# Patient Record
Sex: Female | Born: 1960
Health system: Southern US, Community
[De-identification: ages and names within clinical notes are randomized; demographics above are authoritative.]

## PROBLEM LIST (undated history)

## (undated) DIAGNOSIS — Z8509 Personal history of malignant neoplasm of other digestive organs: Secondary | ICD-10-CM

## (undated) DIAGNOSIS — R011 Cardiac murmur, unspecified: Secondary | ICD-10-CM

## (undated) DIAGNOSIS — C801 Malignant (primary) neoplasm, unspecified: Secondary | ICD-10-CM

## (undated) DIAGNOSIS — Z8349 Family history of other endocrine, nutritional and metabolic diseases: Secondary | ICD-10-CM

## (undated) DIAGNOSIS — IMO0002 Reserved for concepts with insufficient information to code with codable children: Secondary | ICD-10-CM

## (undated) DIAGNOSIS — E785 Hyperlipidemia, unspecified: Secondary | ICD-10-CM

## (undated) DIAGNOSIS — R87619 Unspecified abnormal cytological findings in specimens from cervix uteri: Secondary | ICD-10-CM

## (undated) DIAGNOSIS — E039 Hypothyroidism, unspecified: Secondary | ICD-10-CM

## (undated) DIAGNOSIS — E079 Disorder of thyroid, unspecified: Secondary | ICD-10-CM

## (undated) DIAGNOSIS — M810 Age-related osteoporosis without current pathological fracture: Secondary | ICD-10-CM

## (undated) HISTORY — DX: Disorder of thyroid, unspecified: E07.9

## (undated) HISTORY — DX: Cardiac murmur, unspecified: R01.1

## (undated) HISTORY — DX: Reserved for concepts with insufficient information to code with codable children: IMO0002

## (undated) HISTORY — DX: Family history of other endocrine, nutritional and metabolic diseases: Z83.49

## (undated) HISTORY — DX: Malignant (primary) neoplasm, unspecified: C80.1

## (undated) HISTORY — PX: COLONOSCOPY: SHX174

## (undated) HISTORY — DX: Unspecified abnormal cytological findings in specimens from cervix uteri: R87.619

## (undated) HISTORY — DX: Age-related osteoporosis without current pathological fracture: M81.0

## (undated) HISTORY — DX: Hyperlipidemia, unspecified: E78.5

---

## 2001-05-25 HISTORY — PX: CERVICAL BIOPSY  W/ LOOP ELECTRODE EXCISION: SUR135

## 2002-05-31 ENCOUNTER — Other Ambulatory Visit: Admission: RE | Admit: 2002-05-31 | Discharge: 2002-05-31 | Payer: Self-pay | Admitting: *Deleted

## 2002-12-04 ENCOUNTER — Other Ambulatory Visit: Admission: RE | Admit: 2002-12-04 | Discharge: 2002-12-04 | Payer: Self-pay | Admitting: Obstetrics and Gynecology

## 2003-06-08 ENCOUNTER — Other Ambulatory Visit: Admission: RE | Admit: 2003-06-08 | Discharge: 2003-06-08 | Payer: Self-pay | Admitting: Obstetrics and Gynecology

## 2003-12-12 ENCOUNTER — Other Ambulatory Visit: Admission: RE | Admit: 2003-12-12 | Discharge: 2003-12-12 | Payer: Self-pay | Admitting: Obstetrics and Gynecology

## 2004-07-07 ENCOUNTER — Other Ambulatory Visit: Admission: RE | Admit: 2004-07-07 | Discharge: 2004-07-07 | Payer: Self-pay | Admitting: Obstetrics and Gynecology

## 2004-07-08 ENCOUNTER — Other Ambulatory Visit: Admission: RE | Admit: 2004-07-08 | Discharge: 2004-07-08 | Payer: Self-pay | Admitting: Obstetrics and Gynecology

## 2005-02-02 ENCOUNTER — Other Ambulatory Visit: Admission: RE | Admit: 2005-02-02 | Discharge: 2005-02-02 | Payer: Self-pay | Admitting: Obstetrics and Gynecology

## 2005-02-03 ENCOUNTER — Other Ambulatory Visit: Admission: RE | Admit: 2005-02-03 | Discharge: 2005-02-03 | Payer: Self-pay | Admitting: Obstetrics and Gynecology

## 2005-05-01 ENCOUNTER — Other Ambulatory Visit: Admission: RE | Admit: 2005-05-01 | Discharge: 2005-05-01 | Payer: Self-pay | Admitting: Obstetrics and Gynecology

## 2005-05-04 ENCOUNTER — Other Ambulatory Visit: Admission: RE | Admit: 2005-05-04 | Discharge: 2005-05-04 | Payer: Self-pay | Admitting: Obstetrics and Gynecology

## 2008-09-19 ENCOUNTER — Ambulatory Visit: Payer: Self-pay | Admitting: Sports Medicine

## 2008-09-19 DIAGNOSIS — M21619 Bunion of unspecified foot: Secondary | ICD-10-CM

## 2008-09-19 DIAGNOSIS — M7750 Other enthesopathy of unspecified foot: Secondary | ICD-10-CM

## 2008-09-19 DIAGNOSIS — M21611 Bunion of right foot: Secondary | ICD-10-CM | POA: Insufficient documentation

## 2008-09-19 DIAGNOSIS — M7741 Metatarsalgia, right foot: Secondary | ICD-10-CM | POA: Insufficient documentation

## 2008-09-19 DIAGNOSIS — M79609 Pain in unspecified limb: Secondary | ICD-10-CM | POA: Insufficient documentation

## 2008-11-12 ENCOUNTER — Ambulatory Visit: Payer: Self-pay | Admitting: Sports Medicine

## 2010-07-15 ENCOUNTER — Ambulatory Visit (HOSPITAL_COMMUNITY): Admission: RE | Admit: 2010-07-15 | Payer: Self-pay | Source: Ambulatory Visit | Admitting: Obstetrics and Gynecology

## 2010-07-24 HISTORY — PX: OTHER SURGICAL HISTORY: SHX169

## 2010-08-05 ENCOUNTER — Ambulatory Visit (HOSPITAL_COMMUNITY)
Admission: RE | Admit: 2010-08-05 | Discharge: 2010-08-05 | Disposition: A | Discharge status: Home / Self Care | Payer: Federal, State, Local not specified - PPO | Source: Ambulatory Visit | Attending: Obstetrics and Gynecology | Admitting: Obstetrics and Gynecology

## 2010-08-05 ENCOUNTER — Other Ambulatory Visit: Payer: Self-pay | Admitting: Obstetrics and Gynecology

## 2010-08-05 DIAGNOSIS — D069 Carcinoma in situ of cervix, unspecified: Secondary | ICD-10-CM | POA: Insufficient documentation

## 2010-08-05 LAB — CBC
HCT: 40.1 % (ref 36.0–46.0)
Hemoglobin: 13.1 g/dL (ref 12.0–15.0)
MCH: 31 pg (ref 26.0–34.0)
MCV: 94.8 fL (ref 78.0–100.0)
RBC: 4.23 MIL/uL (ref 3.87–5.11)

## 2010-08-05 LAB — URINALYSIS, ROUTINE W REFLEX MICROSCOPIC
Glucose, UA: NEGATIVE mg/dL
Hgb urine dipstick: NEGATIVE
Ketones, ur: NEGATIVE mg/dL
pH: 7.5 (ref 5.0–8.0)

## 2010-09-10 NOTE — Op Note (Signed)
Carrie Allison, Carrie Allison                ACCOUNT NO.:  [id number]  MEDICAL RECORD NO.:  [id number]           PATIENT TYPE:  O  LOCATION:  [facility]                          [facility]  PHYSICIAN:  Randye Lobo, M.D.   DATE OF BIRTH:  1961-01-25  DATE OF PROCEDURE:  08/05/2010 DATE OF DISCHARGE:                              OPERATIVE REPORT   PREOPERATIVE DIAGNOSES: 1. Discrepancy between Pap smear and colposcopic biopsies. 2. Unsatisfactory colposcopy. 3. History of positive high-risk human papillomavirus and prior     cervical intraepithelial neoplasia III.  POSTOPERATIVE DIAGNOSES: 1. Discrepancy between Pap and colposcopic biopsies. 2. Unsatisfactory colposcopy. 3. History of positive high-risk human papillomavirus and cervical     intraepithelial neoplasia III. 4. Cervical stenosis.  PROCEDURES:  Cold knife conization of the cervix, fractional dilation and curettage.  SURGEON:  Randye Lobo, MD  ASSISTANT:  Gretchen Short, [facility] ANESTHESIA:  General endotracheal.  IV FLUIDS:  1500 mL Ringer's lactate.  ESTIMATED BLOOD LOSS:  Minimal.  URINE OUTPUT:  25 mL by I and O catheterization.  COMPLICATIONS:  None.  INDICATIONS FOR PROCEDURE:  The patient is a 50 year old gravida 1, para 1 Caucasian female, status post LEEP procedure for CIN-III in 1999, who is noted to have discrepancy between her most recent Pap smear and colposcopic biopsies.  The patient has had normal Pap smears for several years.  She does have a history of positive high-risk HPV.  The patient's Pap smear on April 25, 2010, documented ASCUS, cannot rule out high-grade squamous intraepithelial lesion.  Colposcopy performed May 29, 2010, was unsatisfactory.  The endocervical curettage and exocervical biopsies documented mildly inflamed transitional zone epithelium and squamous mucosa respectively.  A plan is now made to proceed with a cold knife conization of the cervix along with a fractional D and C  after risks, benefits, and alternatives are reviewed.  FINDINGS:  Exam under anesthesia revealed a stenotic cervix.  The vaginal mucosa showed signs of atrophy and petechiae just from the vaginal sterilizing prep.  There were no gross lesions of the cervix appreciated.  The cervix appeared to be somewhat flush with the posterior vaginal mucosa.  Palpable exam of the cervix, there appeared to be much less cervix present anteriorly than posteriorly.  With Lugol's application to the cervix, there was very sporadic uptake of the Lugol's in patchy regions on both the cervix and the vaginal mucosa at the apex.  SPECIMENS:  The conization of the cervix was sent to pathology, marked at the 12 o'clock position with a silk suture.  The endocervical and endometrial curettings were sent also as separate specimens.  PROCEDURE:  The patient was reidentified in the preoperative hold area. She received cefotetan 1 g IV.  The patient was escorted to the operating room, where general endotracheal anesthesia was induced.  She was placed in the dorsal lithotomy position.  The vagina and perineum were sterilely prepped and the bladder was catheterized of urine.  She was sterilely draped.  An examination under anesthesia was performed.  There were no palpable abnormalities of the cervix.  The speculum was then placed in  the vagina.  Small dilators were used to identigy the cervical os. Lugol was used to paint the cervix.  Figure-of-eight sutures of #1 chromic were then placed at the 3 and 9 o'clock position to ligate the descending branches of the uterine arteries bilaterally.  A separate simple suture of #1 chromic was placed at the 12 o'clock position on the cervix to act as a retractor.  A scalpel was then used to sharply excise the cone specimen.  A scalpel and an angled handle was then used to excise a cone shaped wedge of the cervix.  The specimen was marked at the 12 o'clock position with  silk suture.  The endocervix was further dilated.  An endocervical curettage was performed with the working curette.  The uterus appeared to be very short in depth measuring approximately 6 cm.  The endometrium was similarly curetted with a working curette.  The specimens were all sent to pathology separately.  Monopolar cautery and a ball was then used to coagulate the perimeter of the base of the conization and this provided excellent hemostasis. Monsel's was then applied across the surgical field.  Hemostasis was good at this time and the procedure was, therefore, concluded.  The patient was taken out of the dorsal lithotomy position.  She was awakened and extubated and escorted to the recovery room in stable condition.  There were no complications to the procedure.  All needle, instrument, and sponge counts were correct.     Randye Lobo, M.D.     BES/MEDQ  D:  08/05/2010  T:  08/06/2010  Job:  [id number]  Electronically Signed by Conley Simmonds M.D. on 09/10/2010 12:12:44 PM

## 2010-11-20 ENCOUNTER — Other Ambulatory Visit: Payer: Self-pay | Admitting: Obstetrics and Gynecology

## 2011-02-19 ENCOUNTER — Other Ambulatory Visit: Payer: Self-pay | Admitting: Obstetrics and Gynecology

## 2011-05-07 ENCOUNTER — Other Ambulatory Visit: Payer: Self-pay | Admitting: Obstetrics and Gynecology

## 2011-05-07 ENCOUNTER — Other Ambulatory Visit (HOSPITAL_COMMUNITY): Payer: Self-pay | Admitting: Obstetrics and Gynecology

## 2011-05-07 DIAGNOSIS — Z780 Asymptomatic menopausal state: Secondary | ICD-10-CM

## 2011-05-07 DIAGNOSIS — N950 Postmenopausal bleeding: Secondary | ICD-10-CM

## 2011-05-13 ENCOUNTER — Ambulatory Visit (HOSPITAL_COMMUNITY)
Admission: RE | Admit: 2011-05-13 | Discharge: 2011-05-13 | Disposition: A | Discharge status: Home / Self Care | Payer: Federal, State, Local not specified - PPO | Source: Ambulatory Visit | Attending: Obstetrics and Gynecology | Admitting: Obstetrics and Gynecology

## 2011-05-13 DIAGNOSIS — Z780 Asymptomatic menopausal state: Secondary | ICD-10-CM

## 2011-05-13 DIAGNOSIS — Z13820 Encounter for screening for osteoporosis: Secondary | ICD-10-CM | POA: Insufficient documentation

## 2011-05-13 DIAGNOSIS — N950 Postmenopausal bleeding: Secondary | ICD-10-CM

## 2011-10-29 ENCOUNTER — Encounter: Payer: Self-pay | Admitting: Internal Medicine

## 2011-10-29 ENCOUNTER — Ambulatory Visit (INDEPENDENT_AMBULATORY_CARE_PROVIDER_SITE_OTHER): Payer: Federal, State, Local not specified - PPO | Admitting: Internal Medicine

## 2011-10-29 VITALS — BP 126/78 | HR 78 | Temp 97.7°F | Resp 16 | Ht 66.0 in | Wt 124.0 lb

## 2011-10-29 DIAGNOSIS — Z8669 Personal history of other diseases of the nervous system and sense organs: Secondary | ICD-10-CM

## 2011-10-29 DIAGNOSIS — Z780 Asymptomatic menopausal state: Secondary | ICD-10-CM

## 2011-10-29 DIAGNOSIS — R87612 Low grade squamous intraepithelial lesion on cytologic smear of cervix (LGSIL): Secondary | ICD-10-CM

## 2011-10-29 DIAGNOSIS — Z87898 Personal history of other specified conditions: Secondary | ICD-10-CM

## 2011-10-29 DIAGNOSIS — IMO0002 Reserved for concepts with insufficient information to code with codable children: Secondary | ICD-10-CM | POA: Insufficient documentation

## 2011-10-29 NOTE — Progress Notes (Signed)
  Subjective:    Patient ID: Carrie Allison, female    DOB: 06/15/1960, 51 y.o.   MRN: [id number]  HPI New pt here for first visit  Former care Dr. Edward Allison.  She also has an appt with Dr. Clelia Allison of [facility] for primary care but has not seen his as yet. PMH of abnormal pap with cryosurgery done 07/2010.  Her last pap was in December to her memory,   She reports that she is having trouble with night flushes Wakes her up 1-2 times per night. No flushing during the day .   No vaginal dryness.  She has been on topical Testim gel  For low libido but she states she is not sure it has made a differerence.    No personal or FHof DVT, PE, breast cancer, no MI, or CVA.  MGM has some type of GYN cancer pt thinks it might have been uterine.    No Known Allergies Past Medical History  Diagnosis Date  . Abnormal Pap smear     H/O  . Sleep apnea    Past Surgical History  Procedure Date  . Cervical biopsy  w/ loop electrode excision 2003  . Cold knife conization 3/12   History   Social History  . Marital Status: Married    Spouse Name: N/A    Number of Children: N/A  . Years of Education: N/A   Occupational History  . Not on file.   Social History Main Topics  . Smoking status: Never Smoker   . Smokeless tobacco: Never Used  . Alcohol Use: No  . Drug Use: No  . Sexually Active: Yes -- Female partner(s)   Other Topics Concern  . Not on file   Social History Narrative  . No narrative on file   Family History  Problem Relation Age of Onset  . Cancer Father     lung  . Hypertension Mother   . Cancer Maternal Grandmother     breast  . Cancer Father     melanoma   Patient Active Problem List  Diagnoses  . METATARSALGIA  . BUNION, RIGHT FOOT  . FOOT PAIN, BILATERAL   Current Outpatient Prescriptions on File Prior to Visit  Medication Sig Dispense Refill  . TESTIM 50 MG/5GM GEL            Review of Systems See HPI    Objective:   Physical Exam  Physical Exam  Nursing  note and vitals reviewed.  Constitutional: She is oriented to person, place, and time. She appears well-developed and well-nourished.  HENT:  Head: Normocephalic and atraumatic.  Cardiovascular: Normal rate and regular rhythm. Exam reveals no gallop and no friction rub.  No murmur heard.  Pulmonary/Chest: Breath sounds normal. She has no wheezes. She has no rales.  Neurological: She is alert and oriented to person, place, and time.  Skin: Skin is warm and dry.  Psychiatric: She has a normal mood and affect. Her behavior is normal.  Ext no edema      Assessment & Plan:  1)  Abnormal pap  Last pap 04/2011 showed LGSIL  She is S/P cryosurgery and will refer to GYN for further evaluation.  She report she has a long history of abnormal paps  Menopause  Given risk/benefit educational sheet.  She states she would like to try OTC Estroven at first.    History of sleep apnea

## 2011-10-29 NOTE — Patient Instructions (Signed)
Will refer to GYN  Dr. Eustaquio Boyden

## 2011-12-23 ENCOUNTER — Other Ambulatory Visit: Payer: Self-pay | Admitting: Obstetrics and Gynecology

## 2012-01-29 ENCOUNTER — Encounter: Payer: Self-pay | Admitting: *Deleted

## 2012-02-17 ENCOUNTER — Encounter: Payer: Federal, State, Local not specified - PPO | Admitting: Internal Medicine

## 2012-03-03 ENCOUNTER — Encounter: Payer: Self-pay | Admitting: *Deleted

## 2012-03-11 ENCOUNTER — Encounter: Payer: Self-pay | Admitting: *Deleted

## 2012-05-04 ENCOUNTER — Ambulatory Visit (INDEPENDENT_AMBULATORY_CARE_PROVIDER_SITE_OTHER): Payer: Federal, State, Local not specified - PPO | Admitting: Sports Medicine

## 2012-05-04 VITALS — BP 110/68 | Ht 67.0 in | Wt 123.0 lb

## 2012-05-04 DIAGNOSIS — M238X9 Other internal derangements of unspecified knee: Secondary | ICD-10-CM

## 2012-05-04 DIAGNOSIS — M25361 Other instability, right knee: Secondary | ICD-10-CM

## 2012-05-04 NOTE — Patient Instructions (Signed)
It was good to see you today.  Please wear the compression sleeve with exercise or when you will be taking a lot of steps.  Do your 3 exercises daily, 15 times each.  We will see you back in 6 weeks, or sooner if needed.  Take care!

## 2012-05-04 NOTE — Assessment & Plan Note (Addendum)
Physical exam consistent with medial patellar instability. Given small body helix patellar strap to help stabilize the patella. Although this is not causing any pain now, it may eventually lead to arthritis. For now, will work on strengthening hip muscles. in particular abductors, with daily exercises (lateral leg raises, standing hip rotations and straight leg raise with foot rotated medially). She should decrease the amount of stepping exercises she does such as her step aerobics class, or if she does the class she should use a small step. She will return in 6 weeks for re-evaluation.

## 2012-05-04 NOTE — Progress Notes (Signed)
  Subjective:    Patient ID: Carrie Allison, female    DOB: December 08, 1960, 51 y.o.   MRN: [id number]  HPI  Pt is a 51 yo F new patient with right knee popping/grinding. She denies any pain in the knee with any movement. She states she has noticed some slight popping in her knee for a while but it has been getting worse in the last 2 months. She states it does not interfere with her daily life. She is unable to identify exactly where she feels the pop. It is most noticeable with climbing stairs. She has no noises when stepping down, but almost always consistently with stepping up. She does work out on a regular basis with [employer], weight lifting and step class- none of which cause her pain to her knee. She denies any injury or past trauma to the knee. She has not tried anything for the popping. Only other pain is mild tennis elbow, but no known osteoarthritis.  Review of Systems Negative except as mentioned in HPI above    Objective:   Physical Exam  Gen: awake, alert, NAD. Healthy appearing female Extremities: Warm and neurovascularly intact. Leg length symmetric. No redness or swelling of knees. Full extension of knees. 5/5 strength.  Right knee: Extension 0-1500 degrees flexion. No crepitus appreciated. Full range of motion with no pain. No TTP of palpation of patella or patellar tendon. No TTP medial or lateral joint line.  Gait: Normal stride. Audible grinding of right knee with step up on platform, without significant pain. With palpation, it feels like site of grinding is medial side of sulcus underneath medial patellar border..     Assessment & Plan:

## 2012-06-15 ENCOUNTER — Ambulatory Visit: Payer: Federal, State, Local not specified - PPO | Admitting: Sports Medicine

## 2012-07-19 ENCOUNTER — Other Ambulatory Visit: Payer: Self-pay | Admitting: Obstetrics and Gynecology

## 2013-05-01 ENCOUNTER — Other Ambulatory Visit (HOSPITAL_COMMUNITY): Payer: Self-pay | Admitting: Obstetrics and Gynecology

## 2013-08-01 ENCOUNTER — Encounter: Payer: Self-pay | Admitting: Internal Medicine

## 2013-08-15 ENCOUNTER — Other Ambulatory Visit: Payer: Self-pay | Admitting: Obstetrics and Gynecology

## 2013-09-19 ENCOUNTER — Ambulatory Visit (AMBULATORY_SURGERY_CENTER): Payer: Self-pay | Admitting: *Deleted

## 2013-09-19 VITALS — Ht 66.0 in | Wt 122.0 lb

## 2013-09-19 DIAGNOSIS — Z1211 Encounter for screening for malignant neoplasm of colon: Secondary | ICD-10-CM

## 2013-09-19 MED ORDER — SOD PICOSULFATE-MAG OX-CIT ACD 10-3.5-12 MG-GM-GM PO PACK: PACK | ORAL | Status: DC

## 2013-09-19 NOTE — Progress Notes (Signed)
No egg or soy allergy  No home oxygen use or problems with anesthesia  No medications for weight loss taken   

## 2013-09-25 ENCOUNTER — Encounter: Payer: Self-pay | Admitting: Internal Medicine

## 2013-10-03 ENCOUNTER — Encounter: Payer: Federal, State, Local not specified - PPO | Admitting: Internal Medicine

## 2013-10-05 ENCOUNTER — Encounter: Payer: Self-pay | Admitting: Internal Medicine

## 2013-10-05 ENCOUNTER — Ambulatory Visit (AMBULATORY_SURGERY_CENTER): Payer: Federal, State, Local not specified - PPO | Admitting: Internal Medicine

## 2013-10-05 VITALS — BP 126/74 | HR 54 | Temp 97.4°F | Resp 20 | Ht 66.0 in | Wt 122.0 lb

## 2013-10-05 DIAGNOSIS — Z1211 Encounter for screening for malignant neoplasm of colon: Secondary | ICD-10-CM

## 2013-10-05 MED ORDER — SODIUM CHLORIDE 0.9 % IV SOLN: 500.0000 mL | INTRAVENOUS | Status: DC

## 2013-10-05 NOTE — Op Note (Signed)
[place]  [address] [place], [id number]   COLONOSCOPY PROCEDURE REPORT  PATIENT: Carrie Allison, Carrie Allison  MR#: [id number] BIRTHDATE: [birth year] , 89  yrs. old GENDER: Female ENDOSCOPIST: Gatha Mayer, MD, [facility] REFERRED BY:W.  Lutricia Feil, M.D. PROCEDURE DATE:  10/05/2013 PROCEDURE:   Colonoscopy, screening First Screening Colonoscopy - Avg.  risk and is 50 yrs.  old or older Yes.  Prior Negative Screening - Now for repeat screening. N/A  History of Adenoma - Now for follow-up colonoscopy & has been > or = to 3 yrs.  N/A  Polyps Removed Today? No.  Recommend repeat exam, <10 yrs? No. ASA CLASS:   Class II INDICATIONS:average risk screening and first colonoscopy. MEDICATIONS: propofol (Diprivan) 200mg  IV, MAC sedation, administered by CRNA, and These medications were titrated to patient response per physician's verbal order  DESCRIPTION OF PROCEDURE:   After the risks benefits and alternatives of the procedure were thoroughly explained, informed consent was obtained.  A digital rectal exam revealed no abnormalities of the rectum.   The LB [id number]  endoscope was introduced through the anus and advanced to the cecum, which was identified by both the appendix and ileocecal valve. No adverse events experienced.   The quality of the prep was Prepopik adequate The instrument was then slowly withdrawn as the colon was fully examined.      COLON FINDINGS: A normal appearing cecum, ileocecal valve, and appendiceal orifice were identified.  The ascending, hepatic flexure, transverse, splenic flexure, descending, sigmoid colon and rectum appeared unremarkable.  No polyps or cancers were seen.   A right colon retroflexion was performed.  Retroflexed views revealed no abnormalities. The time to cecum=2 minutes 33 seconds. Withdrawal time=14 minutes 10 seconds.  The scope was withdrawn and the procedure completed. COMPLICATIONS: There were no  complications.  ENDOSCOPIC IMPRESSION: Normal colonoscopy  RECOMMENDATIONS: Repeat colonoscopy 10 years (2025)   eSigned:  Gatha Mayer, MD, [facility] 10/05/2013 9:36 AM   cc: Janalyn Rouse, MD and The Patient

## 2013-10-05 NOTE — Progress Notes (Signed)
A/ox3 pleased with MAC, report to Celia RN 

## 2013-10-05 NOTE — Patient Instructions (Addendum)
The colonoscopy was normal.  Next routine colonoscopy in 10 years - 2025  I appreciate the opportunity to care for you. Gatha Mayer, MD, [facility]   Discharge instructions given with verbal understanding. Normal exam. Resume previous medications. YOU HAD AN ENDOSCOPIC PROCEDURE TODAY AT [facility]: Refer to the procedure report that was given to you for any specific questions about what was found during the examination.  If the procedure report does not answer your questions, please call your gastroenterologist to clarify.  If you requested that your care partner not be given the details of your procedure findings, then the procedure report has been included in a sealed envelope for you to review at your convenience later.  YOU SHOULD EXPECT: Some feelings of bloating in the abdomen. Passage of more gas than usual.  Walking can help get rid of the air that was put into your GI tract during the procedure and reduce the bloating. If you had a lower endoscopy (such as a colonoscopy or flexible sigmoidoscopy) you may notice spotting of blood in your stool or on the toilet paper. If you underwent a bowel prep for your procedure, then you may not have a normal bowel movement for a few days.  DIET: Your first meal following the procedure should be a light meal and then it is ok to progress to your normal diet.  A half-sandwich or bowl of soup is an example of a good first meal.  Heavy or fried foods are harder to digest and may make you feel nauseous or bloated.  Likewise meals heavy in dairy and vegetables can cause extra gas to form and this can also increase the bloating.  Drink plenty of fluids but you should avoid alcoholic beverages for 24 hours.  ACTIVITY: Your care partner should take you home directly after the procedure.  You should plan to take it easy, moving slowly for the rest of the day.  You can resume normal activity the day after the procedure however you should NOT DRIVE  or use heavy machinery for 24 hours (because of the sedation medicines used during the test).    SYMPTOMS TO REPORT IMMEDIATELY: A gastroenterologist can be reached at any hour.  During normal business hours, 8:30 AM to 5:00 PM Monday through Friday, call [phone].  After hours and on weekends, please call the GI answering service at [phone] who will take a message and have the physician on call contact you.   Following lower endoscopy (colonoscopy or flexible sigmoidoscopy):  Excessive amounts of blood in the stool  Significant tenderness or worsening of abdominal pains  Swelling of the abdomen that is new, acute  Fever of 100F or higher  FOLLOW UP: If any biopsies were taken you will be contacted by phone or by letter within the next 1-3 weeks.  Call your gastroenterologist if you have not heard about the biopsies in 3 weeks.  Our staff will call the home number listed on your records the next business day following your procedure to check on you and address any questions or concerns that you may have at that time regarding the information given to you following your procedure. This is a courtesy call and so if there is no answer at the home number and we have not heard from you through the emergency physician on call, we will assume that you have returned to your regular daily activities without incident.  SIGNATURES/CONFIDENTIALITY: You and/or your care partner have signed paperwork  which will be entered into your electronic medical record.  These signatures attest to the fact that that the information above on your After Visit Summary has been reviewed and is understood.  Full responsibility of the confidentiality of this discharge information lies with you and/or your care-partner.

## 2013-10-06 ENCOUNTER — Telehealth: Payer: Self-pay | Admitting: *Deleted

## 2013-10-06 NOTE — Telephone Encounter (Signed)
  Follow up Call-  Call back number 10/05/2013  Post procedure Call Back phone  # [phone]  Permission to leave phone message Yes     Patient questions:  Do you have a fever, pain , or abdominal swelling? no Pain Score  0 *  Have you tolerated food without any problems? yes  Have you been able to return to your normal activities? yes  Do you have any questions about your discharge instructions: Diet   no Medications  no Follow up visit  no  Do you have questions or concerns about your Care? no  Actions: * If pain score is 4 or above: No action needed, pain <4.

## 2014-03-26 ENCOUNTER — Encounter: Payer: Self-pay | Admitting: Internal Medicine

## 2014-04-12 ENCOUNTER — Other Ambulatory Visit (HOSPITAL_COMMUNITY): Payer: Self-pay | Admitting: Internal Medicine

## 2014-04-12 DIAGNOSIS — M8580 Other specified disorders of bone density and structure, unspecified site: Secondary | ICD-10-CM

## 2014-05-01 ENCOUNTER — Ambulatory Visit (HOSPITAL_COMMUNITY)
Admission: RE | Admit: 2014-05-01 | Discharge: 2014-05-01 | Disposition: A | Discharge status: Home / Self Care | Payer: Federal, State, Local not specified - PPO | Source: Ambulatory Visit | Attending: Internal Medicine | Admitting: Internal Medicine

## 2014-05-01 DIAGNOSIS — M8580 Other specified disorders of bone density and structure, unspecified site: Secondary | ICD-10-CM | POA: Diagnosis not present

## 2014-05-01 DIAGNOSIS — Z13820 Encounter for screening for osteoporosis: Secondary | ICD-10-CM | POA: Insufficient documentation

## 2014-08-29 ENCOUNTER — Other Ambulatory Visit: Payer: Self-pay | Admitting: Obstetrics and Gynecology

## 2014-08-30 LAB — CYTOLOGY - PAP

## 2015-09-11 DIAGNOSIS — Z6820 Body mass index (BMI) 20.0-20.9, adult: Secondary | ICD-10-CM | POA: Diagnosis not present

## 2015-09-11 DIAGNOSIS — Z1231 Encounter for screening mammogram for malignant neoplasm of breast: Secondary | ICD-10-CM | POA: Diagnosis not present

## 2015-09-11 DIAGNOSIS — Z01419 Encounter for gynecological examination (general) (routine) without abnormal findings: Secondary | ICD-10-CM | POA: Diagnosis not present

## 2016-02-19 DIAGNOSIS — D1801 Hemangioma of skin and subcutaneous tissue: Secondary | ICD-10-CM | POA: Diagnosis not present

## 2016-02-19 DIAGNOSIS — D2272 Melanocytic nevi of left lower limb, including hip: Secondary | ICD-10-CM | POA: Diagnosis not present

## 2016-02-19 DIAGNOSIS — D225 Melanocytic nevi of trunk: Secondary | ICD-10-CM | POA: Diagnosis not present

## 2016-02-19 DIAGNOSIS — D2261 Melanocytic nevi of right upper limb, including shoulder: Secondary | ICD-10-CM | POA: Diagnosis not present

## 2016-02-20 DIAGNOSIS — E038 Other specified hypothyroidism: Secondary | ICD-10-CM | POA: Diagnosis not present

## 2016-02-20 DIAGNOSIS — Z0000 Encounter for general adult medical examination without abnormal findings: Secondary | ICD-10-CM | POA: Diagnosis not present

## 2016-02-20 DIAGNOSIS — E784 Other hyperlipidemia: Secondary | ICD-10-CM | POA: Diagnosis not present

## 2016-02-20 DIAGNOSIS — M859 Disorder of bone density and structure, unspecified: Secondary | ICD-10-CM | POA: Diagnosis not present

## 2016-03-05 DIAGNOSIS — Z0000 Encounter for general adult medical examination without abnormal findings: Secondary | ICD-10-CM | POA: Diagnosis not present

## 2016-03-05 DIAGNOSIS — M5382 Other specified dorsopathies, cervical region: Secondary | ICD-10-CM | POA: Diagnosis not present

## 2016-03-05 DIAGNOSIS — E784 Other hyperlipidemia: Secondary | ICD-10-CM | POA: Diagnosis not present

## 2016-03-05 DIAGNOSIS — Z23 Encounter for immunization: Secondary | ICD-10-CM | POA: Diagnosis not present

## 2016-03-05 DIAGNOSIS — M859 Disorder of bone density and structure, unspecified: Secondary | ICD-10-CM | POA: Diagnosis not present

## 2016-03-05 DIAGNOSIS — Z1389 Encounter for screening for other disorder: Secondary | ICD-10-CM | POA: Diagnosis not present

## 2016-03-05 DIAGNOSIS — E038 Other specified hypothyroidism: Secondary | ICD-10-CM | POA: Diagnosis not present

## 2016-03-06 DIAGNOSIS — Z1212 Encounter for screening for malignant neoplasm of rectum: Secondary | ICD-10-CM | POA: Diagnosis not present

## 2016-07-09 ENCOUNTER — Encounter: Payer: Self-pay | Admitting: Sports Medicine

## 2016-07-09 ENCOUNTER — Ambulatory Visit (INDEPENDENT_AMBULATORY_CARE_PROVIDER_SITE_OTHER): Payer: Federal, State, Local not specified - PPO | Admitting: Sports Medicine

## 2016-07-09 VITALS — BP 139/76 | Ht 66.0 in | Wt 125.0 lb

## 2016-07-09 DIAGNOSIS — M238X1 Other internal derangements of right knee: Secondary | ICD-10-CM | POA: Diagnosis not present

## 2016-07-09 DIAGNOSIS — M7711 Lateral epicondylitis, right elbow: Secondary | ICD-10-CM

## 2016-07-09 DIAGNOSIS — M2420 Disorder of ligament, unspecified site: Secondary | ICD-10-CM | POA: Diagnosis not present

## 2016-07-09 MED ORDER — NITROGLYCERIN 0.2 MG/HR TD PT24: MEDICATED_PATCH | TRANSDERMAL | 1 refills | Status: DC

## 2016-07-09 NOTE — Assessment & Plan Note (Signed)
Work on hip abduction weakness as she is getting PF impingement

## 2016-07-09 NOTE — Patient Instructions (Addendum)
1) hip exercises 2) elbow exercises 3) hamstring exercises 4) 1/4 patch of nitroglycerin daily  Nitroglycerin Protocol   Apply 1/4 nitroglycerin patch to affected area daily.  Change position of patch within the affected area every 24 hours.  You may experience a headache during the first 1-2 weeks of using the patch, these should subside.  If you experience headaches after beginning nitroglycerin patch treatment, you may take your preferred over the counter pain reliever.  Another side effect of the nitroglycerin patch is skin irritation or rash related to patch adhesive.  Please notify our office if you develop more severe headaches or rash, and stop the patch.  Tendon healing with nitroglycerin patch may require 12 to 24 weeks depending on the extent of injury.  Men should not use if taking Viagra, Cialis, or Levitra.   Do not use if you have migraines or rosacea.

## 2016-07-09 NOTE — Assessment & Plan Note (Signed)
Plan conservative care with NTG and HEP  If not better check [place] on RTC  6 wks

## 2016-07-09 NOTE — Progress Notes (Signed)
   Subjective:    Patient ID: Carrie Allison, female    DOB: 01-26-1961, 56 y.o.   MRN: [id number]  HPI  R elbow pain Pain started in November. Patient picked up a box that was too heavy and felt a pain in the right outside of her elbow. Pain is persistent and interferes with daily activities. She is "very" right-hand dominant, but has been using her left hand a lot more than normal because of the pain. Pain is worse when holding arm out to open door, trying to drink from a water bottle or glass. Occasional (rare) numbness/tingling on the outside of there arm and in her most lateral fingers. Pain is improved when taking advil, but returns when she stops the medication. No erythema or swelling noted. Full ROM to joint, but pain at lateral elbow in full extension and flexion. Overall pain is slightly better since November, but still very noticeable. No numbness or tingling of fingers.  R knee cracking/popping When patient moves leg to full extension, audible cracking heard. No pain associated with this. No swelling. No history of knee trauma. Patient does work-out classes every day, some of which include squats. History of water skiing accident about 6-7 years ago leading to a torn hamstring. No other known injuries to leg.   L neck cracking/popping When patient looks to the left, she hears popping of her left neck. Daughter noticed how loud it was around Christmas time. Again, no tenderness to area. Full ROM of neck. No numbness or tingling of arms. Doesn't bother her, only curious about why the noise is there.  Review of Systems No fevers, weakness, numbness, tinglnig.    Objective:   Physical Exam  Constitutional: She is oriented to person, place, and time. She appears well-developed and well-nourished. No distress.  CV: Intact distal pulses. Musculoskeletal:  R elbow: No erythema, swelling, or bruising to joint. Tenderness to palpation along lateral epicondyle. Full ROM. L elbow normal. R  knee: Crepitus noted on extension of knee. Palpable and audible. Full ROM. No erythema, bruising, or swelling noted. Patella normal. Strength 4/5 on hip abduction of right leg, compared to 5/5 on hip abduction of left leg. Gluteus medius 3/5 on hip abduction when isolated. Neck: Full ROM. No tenderness to palpation of neck. Sensation intact bilaterally of both upper arms. Strength 5/5 bilaterally in upper arms. Popping sound noted when turning to look to the left at about 90 degrees. Neurological: She is alert and oriented to person, place, and time. strength 5/5 in upper extremities. Strength 5/5 in lower extremities without isolating muscles.  Psychiatric: She has a normal mood and affect. Her behavior is normal.   No imaging done today.     Assessment & Plan:   1. Lateral epicondylitis of right elbow - 1/4 nitroglycerin patch daily - elbow ROM exercises daily - return to clinic in 6 weeks for follow-up  2. Crepitus of joint of right knee - hamstring and hip strengthening exercises daily - avoid full squat - return to clinic in 6 weeks for follow-up  3. Laxity, ligament - reassurance given regarding neck popping - avoid full turning of head to the left to limit popping events  Patient seen and discussed with Dr. Oneida Alar, sports medicine physician.  Freddrick March, MD [facility], PGY-3 07/09/2016  11:38 AM  I observed and examined the patient with the resident and agree with assessment and plan.  Note reviewed and modified by me. Stefanie Libel, MD

## 2016-08-13 ENCOUNTER — Ambulatory Visit: Payer: Federal, State, Local not specified - PPO | Admitting: Sports Medicine

## 2016-09-01 ENCOUNTER — Encounter: Payer: Self-pay | Admitting: Sports Medicine

## 2016-09-01 ENCOUNTER — Ambulatory Visit (INDEPENDENT_AMBULATORY_CARE_PROVIDER_SITE_OTHER): Payer: Federal, State, Local not specified - PPO | Admitting: Sports Medicine

## 2016-09-01 DIAGNOSIS — M7711 Lateral epicondylitis, right elbow: Secondary | ICD-10-CM

## 2016-09-01 DIAGNOSIS — M238X1 Other internal derangements of right knee: Secondary | ICD-10-CM

## 2016-09-01 NOTE — Progress Notes (Signed)
  Carrie Allison - 56 y.o. female MRN [id number]  Date of birth: May 16, 1961  SUBJECTIVE:  Including CC & ROS.   Carrie Allison is a 56 yo F that is following up for right elbow pain and right knee pain. She has been doing her home exercises for her lateral elbow pain they have been helping. She had an acute exacerbation of her elbow pain when she went on vacation. She was having a whole around luggage and this seemed to exacerbate her pain. Prior to that her pain was almost gone. She has cut down on the amount of ibuprofen that she has been taking. She is still using the nitroglycerin. She has not received any injection or physical therapy to her elbow.  She reports that she is having some popping sensation of her right knee. She was in a step aerobics class and had some pain after this. She has been doing her hamstring exercises and reports the pain is mild. She denies any locking or giving way.  ROS: No unexpected weight loss, fever, chills, swelling, instability, muscle pain, numbness/tingling, redness, otherwise see HPI    HISTORY: Past Medical, Surgical, Social, and Family History Reviewed & Updated per EMR.   Pertinent Historical Findings include: PMSHx -  Hypothyroidism  PSHx - no tobacco use     DATA REVIEWED: None  PHYSICAL EXAM:  VS: BP:128/65  HR:68bpm  TEMP: ( )  RESP:   HT:5\' 6"  (167.6 cm)   WT:125 lb (56.7 kg)  BMI:20.2 PHYSICAL EXAM: Gen: NAD, alert, cooperative with exam, well-appearing HEENT: clear conjunctiva, EOMI CV:  no edema, capillary refill brisk,  Resp: non-labored, normal speech Skin: no rashes, normal turgor  Neuro: no gross deficits.  Psych:  alert and oriented Right Elbow: Unremarkable to inspection. Range of motion full pronation, supination, flexion, extension. Strength is full to all of the above directions Stable to varus, valgus stress.  Normal wrist ROM  Some TTP of the lateral epicondyle Right Knee: Normal to inspection with no erythema or  effusion or obvious bony abnormalities. Palpation normal with no warmth, joint line tenderness, patellar tenderness, or condyle tenderness. ROM full in flexion and extension and lower leg rotation. Ligaments with solid consistent endpoints including  LCL, MCL. Non painful patellar compression. Patellar glide without crepitus. Patellar and quadriceps tendons unremarkable. Quadriceps strength is normal.  Some weakness with bilateral hip abduction but improved from previously Somewhat less she was performing 1 leg step downs on the right compared to the left Neurovascular intact    ASSESSMENT & PLAN:   Lateral epicondylitis of right elbow Had improvement with conservative therapy with recent acute exacerbation. - Continue home exercises - Continue nitroglycerin - Follow-up in 2 months  Crepitus of joint of right knee Has some weakness in her hips that is likely contributing to her popping and misalignment with activity. - Continue home exercise program already prescribed - Add 1 leg step downs and 1 leg mini squats with dumbbells - Follow-up in 2 months

## 2016-09-01 NOTE — Assessment & Plan Note (Signed)
Has some weakness in her hips that is likely contributing to her popping and misalignment with activity. - Continue home exercise program already prescribed - Add 1 leg step downs and 1 leg mini squats with dumbbells - Follow-up in 2 months

## 2016-09-01 NOTE — Assessment & Plan Note (Signed)
Had improvement with conservative therapy with recent acute exacerbation. - Continue home exercises - Continue nitroglycerin - Follow-up in 2 months

## 2016-09-14 DIAGNOSIS — Z01419 Encounter for gynecological examination (general) (routine) without abnormal findings: Secondary | ICD-10-CM | POA: Diagnosis not present

## 2016-09-14 DIAGNOSIS — Z1231 Encounter for screening mammogram for malignant neoplasm of breast: Secondary | ICD-10-CM | POA: Diagnosis not present

## 2016-09-14 DIAGNOSIS — Z6820 Body mass index (BMI) 20.0-20.9, adult: Secondary | ICD-10-CM | POA: Diagnosis not present

## 2016-10-29 ENCOUNTER — Ambulatory Visit: Payer: Federal, State, Local not specified - PPO | Admitting: Sports Medicine

## 2016-11-10 ENCOUNTER — Encounter: Payer: Self-pay | Admitting: Sports Medicine

## 2016-11-10 ENCOUNTER — Ambulatory Visit (INDEPENDENT_AMBULATORY_CARE_PROVIDER_SITE_OTHER): Payer: Federal, State, Local not specified - PPO | Admitting: Sports Medicine

## 2016-11-10 DIAGNOSIS — M25361 Other instability, right knee: Secondary | ICD-10-CM

## 2016-11-10 DIAGNOSIS — M7711 Lateral epicondylitis, right elbow: Secondary | ICD-10-CM

## 2016-11-10 NOTE — Progress Notes (Signed)
  BAMA HANSELMAN - 56 y.o. female MRN [id number]  Date of birth: 14-Jan-1961  SUBJECTIVE:  Including CC & ROS.   Ms. Dagley is a 58 showed female is following up for her right elbow pain in her right knee pain. She reports that her right elbow is almost percent. She has been beginning to do her regular workouts and yoga poses. She feels like her elbow is weaker than her left side. She is not taking any medications for the pain. Just continue to do home exercises that were provided.   Right knee is still popping from time to time. She did tear her hamstring about 5-6 years ago. She has been compliant with her home exercise program and feels stronger. She denies any pain but does have a popping sensation found to time. She does have some tenderness going down stairs. She is not using any compression eyes. She denies any swelling, locking, or giving way.  ROS: No unexpected weight loss, fever, chills, swelling, instability, muscle pain, numbness/tingling, redness, otherwise see HPI   HISTORY: Past Medical, Surgical, Social, and Family History Reviewed & Updated per EMR.   Pertinent Historical Findings include: PMHx: hypothyroidism Surgical:    cervical biopsy  Social:  No tobacco use   PHYSICAL EXAM:  VS: BP 116/78   Ht 5\' 6"  (1.676 m)   Wt 125 lb (56.7 kg)   BMI 20.18 kg/m  PHYSICAL EXAM: Gen: NAD, alert, cooperative with exam, well-appearing HEENT: clear conjunctiva, EOMI CV:  no edema, capillary refill brisk,  Resp: non-labored, normal speech Skin: no rashes, normal turgor  Neuro: no gross deficits.  Psych:  alert and oriented MSK:  Right Elbow: Unremarkable to inspection. Range of motion full pronation, supination, flexion, extension. Strength is full to all of the above directions Right Knee: Normal to inspection with no erythema or effusion or obvious bony abnormalities. Palpation normal with no warmth, joint line tenderness, patellar tenderness, or condyle tenderness. ROM full  in flexion and extension and lower leg rotation. Ligaments with solid consistent endpoints including LCL, MCL. Negative Mcmurray's Non painful patellar compression. Patellar glide without crepitus. Patellar and quadriceps tendons unremarkable. Hamstring and quadriceps strength is normal. Good strength with hamstring testing as well as hip abduction.  Left and right Feet:  Loss of the transverse arch bilaterally. Splaying between the second and third digit on the right. Some splaying between the second and third on the left as well. Some callus formation on the plantar forefoot. Has maintained her longitudinal arch. Neurovascularly intact.   ASSESSMENT & PLAN:   Patellar instability of right knee She has significant increase strength of her hip abduction. She does have some popping but no pain. - Advised to continue her home exercise program as 3 times a week. - She had follow-up as needed   Lateral epicondylitis of right elbow She seems to almost have complete resolution of her symptoms. She is starting to get back to regular exercise routine with no symptoms. She can follow-up with [place] as needed and encouraged to continue her home exercises

## 2016-11-10 NOTE — Assessment & Plan Note (Addendum)
She has significant increase strength of her hip abduction. She does have some popping but no pain. - Advised to continue her home exercise program as 3 times a week. - She had follow-up as needed

## 2016-11-10 NOTE — Assessment & Plan Note (Signed)
She seems to almost have complete resolution of her symptoms. She is starting to get back to regular exercise routine with no symptoms. She can follow-up with [place] as needed and encouraged to continue her home exercises

## 2016-11-11 NOTE — Patient Instructions (Signed)
Diet recommendations (anti-inflammatory diet) that will assist you with your joint issues:   Kale  arugula  Collards  Beets  Red spinach  Broccoli   Also, consider a minimum of 3-5 servings of fruit and 3-5 servings of vegetables daily.

## 2017-03-02 DIAGNOSIS — M859 Disorder of bone density and structure, unspecified: Secondary | ICD-10-CM | POA: Diagnosis not present

## 2017-03-02 DIAGNOSIS — D2272 Melanocytic nevi of left lower limb, including hip: Secondary | ICD-10-CM | POA: Diagnosis not present

## 2017-03-02 DIAGNOSIS — D2261 Melanocytic nevi of right upper limb, including shoulder: Secondary | ICD-10-CM | POA: Diagnosis not present

## 2017-03-02 DIAGNOSIS — E7849 Other hyperlipidemia: Secondary | ICD-10-CM | POA: Diagnosis not present

## 2017-03-02 DIAGNOSIS — D225 Melanocytic nevi of trunk: Secondary | ICD-10-CM | POA: Diagnosis not present

## 2017-03-02 DIAGNOSIS — R8290 Unspecified abnormal findings in urine: Secondary | ICD-10-CM | POA: Diagnosis not present

## 2017-03-02 DIAGNOSIS — D1801 Hemangioma of skin and subcutaneous tissue: Secondary | ICD-10-CM | POA: Diagnosis not present

## 2017-03-02 DIAGNOSIS — E038 Other specified hypothyroidism: Secondary | ICD-10-CM | POA: Diagnosis not present

## 2017-03-09 DIAGNOSIS — Z23 Encounter for immunization: Secondary | ICD-10-CM | POA: Diagnosis not present

## 2017-03-09 DIAGNOSIS — E7849 Other hyperlipidemia: Secondary | ICD-10-CM | POA: Diagnosis not present

## 2017-03-09 DIAGNOSIS — R102 Pelvic and perineal pain: Secondary | ICD-10-CM | POA: Diagnosis not present

## 2017-03-09 DIAGNOSIS — Z0000 Encounter for general adult medical examination without abnormal findings: Secondary | ICD-10-CM | POA: Diagnosis not present

## 2017-03-09 DIAGNOSIS — E038 Other specified hypothyroidism: Secondary | ICD-10-CM | POA: Diagnosis not present

## 2017-03-09 DIAGNOSIS — Z1389 Encounter for screening for other disorder: Secondary | ICD-10-CM | POA: Diagnosis not present

## 2017-03-09 DIAGNOSIS — M8580 Other specified disorders of bone density and structure, unspecified site: Secondary | ICD-10-CM | POA: Diagnosis not present

## 2017-03-11 ENCOUNTER — Other Ambulatory Visit: Payer: Self-pay | Admitting: Internal Medicine

## 2017-03-11 DIAGNOSIS — R102 Pelvic and perineal pain: Secondary | ICD-10-CM

## 2017-03-15 DIAGNOSIS — Z1212 Encounter for screening for malignant neoplasm of rectum: Secondary | ICD-10-CM | POA: Diagnosis not present

## 2017-03-18 ENCOUNTER — Ambulatory Visit
Admission: RE | Admit: 2017-03-18 | Discharge: 2017-03-18 | Disposition: A | Discharge status: Home / Self Care | Payer: Federal, State, Local not specified - PPO | Source: Ambulatory Visit | Attending: Internal Medicine | Admitting: Internal Medicine

## 2017-03-18 DIAGNOSIS — N83291 Other ovarian cyst, right side: Secondary | ICD-10-CM | POA: Diagnosis not present

## 2017-03-18 DIAGNOSIS — R102 Pelvic and perineal pain: Secondary | ICD-10-CM

## 2017-03-24 ENCOUNTER — Other Ambulatory Visit (HOSPITAL_COMMUNITY): Payer: Self-pay | Admitting: Obstetrics and Gynecology

## 2017-03-24 DIAGNOSIS — R1032 Left lower quadrant pain: Secondary | ICD-10-CM

## 2017-03-24 DIAGNOSIS — N83202 Unspecified ovarian cyst, left side: Secondary | ICD-10-CM | POA: Diagnosis not present

## 2017-03-26 ENCOUNTER — Ambulatory Visit (HOSPITAL_COMMUNITY)
Admission: RE | Admit: 2017-03-26 | Discharge: 2017-03-26 | Disposition: A | Discharge status: Home / Self Care | Payer: Federal, State, Local not specified - PPO | Source: Ambulatory Visit | Attending: Obstetrics and Gynecology | Admitting: Obstetrics and Gynecology

## 2017-03-26 ENCOUNTER — Encounter (HOSPITAL_COMMUNITY): Payer: Self-pay

## 2017-03-26 DIAGNOSIS — R1032 Left lower quadrant pain: Secondary | ICD-10-CM | POA: Diagnosis not present

## 2017-03-26 DIAGNOSIS — R1902 Left upper quadrant abdominal swelling, mass and lump: Secondary | ICD-10-CM | POA: Insufficient documentation

## 2017-03-26 DIAGNOSIS — R1031 Right lower quadrant pain: Secondary | ICD-10-CM | POA: Diagnosis not present

## 2017-03-26 MED ORDER — IOPAMIDOL (ISOVUE-300) INJECTION 61%
100.0000 mL | Freq: Once | INTRAVENOUS | Status: AC | PRN
  Administered 2017-03-26: 100 mL via INTRAVENOUS

## 2017-03-29 ENCOUNTER — Telehealth: Payer: Self-pay | Admitting: Internal Medicine

## 2017-03-29 NOTE — Telephone Encounter (Signed)
Dr Carlean Purl please review for urgency of appt.  Records are on your desk.

## 2017-03-29 NOTE — Telephone Encounter (Signed)
She should have an EUS - I think Dr. Ardis Hughs would agree that is best next step. I am ccing him to see when he might be able to put her on his schedule.

## 2017-03-29 NOTE — Telephone Encounter (Signed)
Received urgent referral for pt to be seen for endoscopy and biopsy based on CT scan done on 11.2.18. Please advise on scheduling. Referral given to nurse for review.

## 2017-03-30 ENCOUNTER — Other Ambulatory Visit: Payer: Self-pay

## 2017-03-30 DIAGNOSIS — K3189 Other diseases of stomach and duodenum: Secondary | ICD-10-CM

## 2017-03-30 NOTE — Telephone Encounter (Signed)
I agree that the next best step is upper endoscopic ultrasound.  Looks like a GIST, pretty close to the tail the pancreas is well.  Patty, can you contact her to arrange for upper EUS next Thursday, November 15 with MAC sedation for gastric mass.

## 2017-03-30 NOTE — Telephone Encounter (Signed)
The pt has been scheduled for 04/08/17 at 130 pm pt needs to be instructed

## 2017-03-31 ENCOUNTER — Encounter (HOSPITAL_COMMUNITY): Payer: Self-pay

## 2017-03-31 NOTE — Telephone Encounter (Signed)
EUS scheduled, pt instructed and medications reviewed.  Patient instructions mailed to home.  Patient to call with any questions or concerns.  

## 2017-03-31 NOTE — Telephone Encounter (Signed)
Left message on machine to call back instructions mailed to the home.

## 2017-04-01 ENCOUNTER — Encounter (HOSPITAL_COMMUNITY): Payer: Self-pay

## 2017-04-01 ENCOUNTER — Other Ambulatory Visit: Payer: Self-pay

## 2017-04-08 ENCOUNTER — Other Ambulatory Visit: Payer: Self-pay

## 2017-04-08 ENCOUNTER — Telehealth: Payer: Self-pay

## 2017-04-08 ENCOUNTER — Ambulatory Visit (HOSPITAL_COMMUNITY)
Admission: RE | Admit: 2017-04-08 | Discharge: 2017-04-08 | Disposition: A | Discharge status: Home / Self Care | Payer: Federal, State, Local not specified - PPO | Source: Ambulatory Visit | Attending: Gastroenterology | Admitting: Gastroenterology

## 2017-04-08 ENCOUNTER — Ambulatory Visit (HOSPITAL_COMMUNITY): Payer: Federal, State, Local not specified - PPO | Admitting: Anesthesiology

## 2017-04-08 ENCOUNTER — Encounter (HOSPITAL_COMMUNITY): Payer: Self-pay

## 2017-04-08 ENCOUNTER — Encounter (HOSPITAL_COMMUNITY)
Admission: RE | Disposition: A | Discharge status: Home / Self Care | Payer: Self-pay | Source: Ambulatory Visit | Attending: Gastroenterology

## 2017-04-08 DIAGNOSIS — E039 Hypothyroidism, unspecified: Secondary | ICD-10-CM | POA: Insufficient documentation

## 2017-04-08 DIAGNOSIS — K319 Disease of stomach and duodenum, unspecified: Secondary | ICD-10-CM | POA: Diagnosis not present

## 2017-04-08 DIAGNOSIS — C49A0 Gastrointestinal stromal tumor, unspecified site: Secondary | ICD-10-CM

## 2017-04-08 DIAGNOSIS — K3189 Other diseases of stomach and duodenum: Secondary | ICD-10-CM | POA: Diagnosis not present

## 2017-04-08 DIAGNOSIS — Z79899 Other long term (current) drug therapy: Secondary | ICD-10-CM | POA: Diagnosis not present

## 2017-04-08 DIAGNOSIS — Z79890 Hormone replacement therapy (postmenopausal): Secondary | ICD-10-CM | POA: Diagnosis not present

## 2017-04-08 DIAGNOSIS — C49A2 Gastrointestinal stromal tumor of stomach: Secondary | ICD-10-CM | POA: Diagnosis not present

## 2017-04-08 DIAGNOSIS — R933 Abnormal findings on diagnostic imaging of other parts of digestive tract: Secondary | ICD-10-CM | POA: Diagnosis not present

## 2017-04-08 DIAGNOSIS — D490 Neoplasm of unspecified behavior of digestive system: Secondary | ICD-10-CM | POA: Diagnosis not present

## 2017-04-08 DIAGNOSIS — M1990 Unspecified osteoarthritis, unspecified site: Secondary | ICD-10-CM | POA: Diagnosis not present

## 2017-04-08 DIAGNOSIS — M21611 Bunion of right foot: Secondary | ICD-10-CM | POA: Diagnosis not present

## 2017-04-08 HISTORY — PX: EUS: SHX5427

## 2017-04-08 HISTORY — DX: Hypothyroidism, unspecified: E03.9

## 2017-04-08 SURGERY — UPPER ENDOSCOPIC ULTRASOUND (EUS) LINEAR
Anesthesia: Monitor Anesthesia Care

## 2017-04-08 MED ORDER — PROPOFOL 10 MG/ML IV BOLUS
INTRAVENOUS | Status: AC
  Filled 2017-04-08: qty 40

## 2017-04-08 MED ORDER — PROPOFOL 500 MG/50ML IV EMUL
INTRAVENOUS | Status: DC | PRN
  Administered 2017-04-08: 150 ug/kg/min via INTRAVENOUS

## 2017-04-08 MED ORDER — LIDOCAINE 2% (20 MG/ML) 5 ML SYRINGE
INTRAMUSCULAR | Status: AC
  Filled 2017-04-08: qty 5

## 2017-04-08 MED ORDER — PROPOFOL 10 MG/ML IV BOLUS
INTRAVENOUS | Status: DC | PRN
  Administered 2017-04-08 (×3): 20 mg via INTRAVENOUS

## 2017-04-08 MED ORDER — LACTATED RINGERS IV SOLN
INTRAVENOUS | Status: DC
  Administered 2017-04-08: 12:00:00 via INTRAVENOUS

## 2017-04-08 MED ORDER — LIDOCAINE 2% (20 MG/ML) 5 ML SYRINGE
INTRAMUSCULAR | Status: DC | PRN
  Administered 2017-04-08: 60 mg via INTRAVENOUS

## 2017-04-08 MED ORDER — SODIUM CHLORIDE 0.9 % IV SOLN: INTRAVENOUS | Status: DC

## 2017-04-08 NOTE — Anesthesia Procedure Notes (Signed)
Procedure Name: MAC Date/Time: 04/08/2017 12:52 PM Performed by: Dione Booze, CRNA Pre-anesthesia Checklist: Patient identified, Emergency Drugs available, Suction available and Patient being monitored Patient Re-evaluated:Patient Re-evaluated prior to induction Oxygen Delivery Method: Nasal cannula Placement Confirmation: positive ETCO2

## 2017-04-08 NOTE — Telephone Encounter (Signed)
Referral has been made and records faxed to [facility].

## 2017-04-08 NOTE — Transfer of Care (Signed)
Immediate Anesthesia Transfer of Care Note  Patient: Carrie Allison  Procedure(s) Performed: UPPER ENDOSCOPIC ULTRASOUND (EUS) LINEAR (N/A )  Patient Location: PACU and Endoscopy Unit  Anesthesia Type:MAC  Level of Consciousness: awake and patient cooperative  Airway & Oxygen Therapy: Patient Spontanous Breathing and Patient connected to nasal cannula oxygen  Post-op Assessment: Report given to RN and Post -op Vital signs reviewed and stable  Post vital signs: Reviewed and stable  Last Vitals:  Vitals:   04/08/17 1208  BP: 134/69  Pulse: 67  Resp: 18  Temp: 36.9 C  SpO2: 98%    Last Pain:  Vitals:   04/08/17 1208  TempSrc: Oral         Complications: No apparent anesthesia complications

## 2017-04-08 NOTE — Discharge Instructions (Signed)
Monitored Anesthesia Care, Care After °These instructions provide you with information about caring for yourself after your procedure. Your health care provider may also give you more specific instructions. Your treatment has been planned according to current medical practices, but problems sometimes occur. Call your health care provider if you have any problems or questions after your procedure. °What can I expect after the procedure? °After your procedure, it is common to: °· Feel sleepy for several hours. °· Feel clumsy and have poor balance for several hours. °· Feel forgetful about what happened after the procedure. °· Have poor judgment for several hours. °· Feel nauseous or vomit. °· Have a sore throat if you had a breathing tube during the procedure. ° °Follow these instructions at home: °For at least 24 hours after the procedure: ° °· Do not: °? Participate in activities in which you could fall or become injured. °? Drive. °? Use heavy machinery. °? Drink alcohol. °? Take sleeping pills or medicines that cause drowsiness. °? Make important decisions or sign legal documents. °? Take care of children on your own. °· Rest. °Eating and drinking °· Follow the diet that is recommended by your health care provider. °· If you vomit, drink water, juice, or soup when you can drink without vomiting. °· Make sure you have little or no nausea before eating solid foods. °General instructions °· Have a responsible adult stay with you until you are awake and alert. °· Take over-the-counter and prescription medicines only as told by your health care provider. °· If you smoke, do not smoke without supervision. °· Keep all follow-up visits as told by your health care provider. This is important. °Contact a health care provider if: °· You keep feeling nauseous or you keep vomiting. °· You feel light-headed. °· You develop a rash. °· You have a fever. °Get help right away if: °· You have trouble breathing. °This information is  not intended to replace advice given to you by your health care provider. Make sure you discuss any questions you have with your health care provider. °Document Released: 09/01/2015 Document Revised: 01/01/2016 Document Reviewed: 09/01/2015 °Elsevier Interactive Patient Education © 2018 [employer] °YOU HAD AN ENDOSCOPIC PROCEDURE TODAY: Refer to the procedure report that was given to you for any specific questions about what was found during the examination.  If the procedure report does not answer your questions, please call your gastroenterologist to clarify. ° °YOU SHOULD EXPECT: Some feelings of bloating in the abdomen. Passage of more gas than usual.  Walking can help get rid of the air that was put into your GI tract during the procedure and reduce the bloating. If you had a lower endoscopy (such as a colonoscopy or flexible sigmoidoscopy) you may notice spotting of blood in your stool or on the toilet paper.  ° °DIET: Your first meal following the procedure should be a light meal and then it is ok to progress to your normal diet.  A half-sandwich or bowl of soup is an example of a good first meal.  Heavy or fried foods are harder to digest and may make you feel nasueas or bloated.  Drink plenty of fluids but you should avoid alcoholic beverages for 24 hours. ° °ACTIVITY: Your care partner should take you home directly after the procedure.  You should plan to take it easy, moving slowly for the rest of the day.  You can resume normal activity the day after the procedure however you should NOT DRIVE or use   heavy machinery for 24 hours (because of the sedation medicines used during the test).   ° °SYMPTOMS TO REPORT IMMEDIATELY  °A gastroenterologist can be reached at any hour.  Please call your doctor's office for any of the following symptoms: ° °· Following lower endoscopy (colonoscopy, flexible sigmoidoscopy) ° Excessive amounts of blood in the stool ° Significant tenderness, worsening of abdominal  pains ° Swelling of the abdomen that is new, acute ° Fever of 100° or higher °· Following upper endoscopy (EGD, EUS, ERCP) ° Vomiting of blood or coffee ground material ° New, significant abdominal pain ° New, significant chest pain or pain under the shoulder blades ° Painful or persistently difficult swallowing ° New shortness of breath ° Black, tarry-looking stools ° °FOLLOW UP: °If any biopsies were taken you will be contacted by phone or by letter within the next 1-3 weeks.  Call your gastroenterologist if you have not heard about the biopsies in 3 weeks.  °Please also call your gastroenterologist's office with any specific questions about appointments or follow up tests. ° °

## 2017-04-08 NOTE — Telephone Encounter (Signed)
-----   Message from Milus Banister, MD sent at 04/08/2017  1:51 PM EST ----- Glendell Docker, Just completed EUS  - 4.2cm by 3cm incidental submucosal gastric mass along the posterior wall of the stomach of the proximal stomach. This is consistent with a gastric GIST and at this size (>2cm) she should be considered for surgical resection. Await final pathology results.  Aggie Douse, She needs referral to [facility]. She prefers Dr. Zella Richer. See below.   Sherren Mocha, I think you guys are neighbors. Not sure when you are officially gone, she's hoping you can help with this but knows you are retiring soon and trusts we will point her towards the right surgeon if you cannot help.  Thanks  Dj

## 2017-04-08 NOTE — Anesthesia Preprocedure Evaluation (Signed)
Anesthesia Evaluation  Patient identified by MRN, date of birth, ID band Patient awake    Reviewed: Allergy & Precautions, NPO status , Patient's Chart, lab work & pertinent test results  Airway Mallampati: II  TM Distance: >3 FB Neck ROM: Full    Dental no notable dental hx. (+) Caps, Teeth Intact   Pulmonary neg pulmonary ROS,    Pulmonary exam normal breath sounds clear to auscultation       Cardiovascular negative cardio ROS Normal cardiovascular exam Rhythm:Regular Rate:Normal     Neuro/Psych negative neurological ROS  negative psych ROS   GI/Hepatic Neg liver ROS, Gastric mass    Endo/Other  Hypothyroidism   Renal/GU negative Renal ROS  negative genitourinary   Musculoskeletal  (+) Arthritis , Osteoarthritis,    Abdominal   Peds  Hematology negative hematology ROS (+)   Anesthesia Other Findings   Reproductive/Obstetrics                             Anesthesia Physical Anesthesia Plan  ASA: II  Anesthesia Plan: MAC   Post-op Pain Management:    Induction: Intravenous  PONV Risk Score and Plan:   Airway Management Planned: Natural Airway and Nasal Cannula  Additional Equipment:   Intra-op Plan:   Post-operative Plan:   Informed Consent: I have reviewed the patients History and Physical, chart, labs and discussed the procedure including the risks, benefits and alternatives for the proposed anesthesia with the patient or authorized representative who has indicated his/her understanding and acceptance.   Dental advisory given  Plan Discussed with: CRNA, Anesthesiologist and Surgeon  Anesthesia Plan Comments:         Anesthesia Quick Evaluation

## 2017-04-08 NOTE — Anesthesia Postprocedure Evaluation (Signed)
Anesthesia Post Note  Patient: Carrie Allison  Procedure(s) Performed: UPPER ENDOSCOPIC ULTRASOUND (EUS) LINEAR (N/A )     Patient location during evaluation: PACU Anesthesia Type: MAC Level of consciousness: awake and alert and oriented Pain management: pain level controlled Vital Signs Assessment: post-procedure vital signs reviewed and stable Respiratory status: spontaneous breathing, nonlabored ventilation and respiratory function stable Cardiovascular status: stable and blood pressure returned to baseline Postop Assessment: no apparent nausea or vomiting Anesthetic complications: no    Last Vitals:  Vitals:   04/08/17 1345 04/08/17 1350  BP:  (!) 145/90  Pulse: 74 77  Resp: 18 18  Temp:    SpO2: 100% 100%    Last Pain:  Vitals:   04/08/17 1331  TempSrc: Oral                 [name]

## 2017-04-08 NOTE — H&P (Signed)
  HPI: This is a 56 yo woman recently found to have a gastric mass on CT  Chief complaint is gastric mass, likely incidental  ROS: complete GI ROS as described in HPI, all other review negative.  Constitutional:  No unintentional weight loss   Past Medical History:  Diagnosis Date  . Abnormal Pap smear    H/O  . Hypothyroidism   . Thyroid disease    hypothyroid    Past Surgical History:  Procedure Laterality Date  . CERVICAL BIOPSY  W/ LOOP ELECTRODE EXCISION  2003  . cold knife conization  3/12    Current Facility-Administered Medications  Medication Dose Route Frequency Provider Last Rate Last Dose  . 0.9 %  sodium chloride infusion   Intravenous Continuous Milus Banister, MD      . lactated ringers infusion   Intravenous Continuous Milus Banister, MD        Allergies as of 03/30/2017 - Review Complete 03/26/2017  Allergen Reaction Noted  . Fish allergy  10/29/2011    Family History  Problem Relation Age of Onset  . Hypertension Mother   . Cancer Maternal Grandmother        breast  . Cancer Father        lung/melanoma  . Colon cancer Neg Hx   . Esophageal cancer Neg Hx   . Rectal cancer Neg Hx   . Stomach cancer Neg Hx     Social History   Socioeconomic History  . Marital status: Married    Spouse name: Not on file  . Number of children: Not on file  . Years of education: Not on file  . Highest education level: Not on file  Social Needs  . Financial resource strain: Not on file  . Food insecurity - worry: Not on file  . Food insecurity - inability: Not on file  . Transportation needs - medical: Not on file  . Transportation needs - non-medical: Not on file  Occupational History  . Not on file  Tobacco Use  . Smoking status: Never Smoker  . Smokeless tobacco: Never Used  Substance and Sexual Activity  . Alcohol use: No  . Drug use: No  . Sexual activity: Yes    Partners: Male  Other Topics Concern  . Not on file  Social History Narrative   . Not on file     Physical Exam: BP 134/69   Pulse 67   Temp 98.4 F (36.9 C) (Oral)   Resp 18   Ht 5\' 6"  (1.676 m)   Wt 125 lb (56.7 kg)   SpO2 98%   BMI 20.18 kg/m  Constitutional: generally well-appearing Psychiatric: alert and oriented x3 Abdomen: soft, nontender, nondistended, no obvious ascites, no peritoneal signs, normal bowel sounds No peripheral edema noted in lower extremities  Assessment and plan: 56 y.o. female with gastric mass, incidental  For upper EUS today, FNA  Please see the "Patient Instructions" section for addition details about the plan.  Owens Loffler, MD [facility] 04/08/2017, 12:37 PM

## 2017-04-08 NOTE — Op Note (Signed)
[facility] Patient Name: Carrie Allison Procedure Date: 04/08/2017 MRN: [id number] Attending MD: Milus Banister , MD Date of Birth: 03-21-1961 CSN: [id number] Age: 56 Admit Type: Outpatient Procedure:                Upper EUS Indications:              incidentally noted mass in stomach on recent CT scan Providers:                Milus Banister, MD, Cleda Daub, RN, Corliss Parish, Technician Referring MD:              Medicines:                Monitored Anesthesia Care Complications:            No immediate complications. Estimated blood loss:                            None. Estimated Blood Loss:     Estimated blood loss: none. Procedure:                Pre-Anesthesia Assessment:                           - Prior to the procedure, a History and Physical                            was performed, and patient medications and                            allergies were reviewed. The patient's tolerance of                            previous anesthesia was also reviewed. The risks                            and benefits of the procedure and the sedation                            options and risks were discussed with the patient.                            All questions were answered, and informed consent                            was obtained. Prior Anticoagulants: The patient has                            taken no previous anticoagulant or antiplatelet                            agents. ASA Grade Assessment: II - A patient with  mild systemic disease. After reviewing the risks                            and benefits, the patient was deemed in                            satisfactory condition to undergo the procedure.                           After obtaining informed consent, the endoscope was                            passed under direct vision. Throughout the                            procedure, the  patient's blood pressure, pulse, and                            oxygen saturations were monitored continuously. The                            [id number] ([id number]) scope was introduced through                            the mouth, and advanced to the second part of                            duodenum. The upper EUS was accomplished without                            difficulty. The patient tolerated the procedure                            well. Scope In: Scope Out: Findings:      Endoscopic Finding :      The esophagus was normal.      There was a medium sized submucosal lesion in the proximal stomach,       posterior wall. The mucosa throughout the stomach was normal.      The duodenum was normal.      Endosonographic Finding :      1. The submucosal lesion described above correlated with a round, 4.2cm       by 3.0cm, hypoechoic, heterogeneous mass that clearly communicated with       the muscularis propria layer of the gastric wall. The mass abuts the       splenic hilum. Fine needle aspiration was performed using a transgastric       approach (19 gauge once with more than usual blood aspirated, then 25       gauge twice). A cytotechnologist was present to evaluate the adequacy of       the specimen. Final cytology results are pending.      2. The gastric wall was otherwise normal.      3. Limited evaluation of the pancreas, liver, gallbladder were all       normal. Impression:               -  4.2cm by 3cm incidental submucosal gastric mass                            along the posterior wall of the stomach of the                            proximal stomach. This is consistent with a gastric                            GIST and at this size (>2cm) she should be                            considered for surgical resection. Await final                            pathology results. Moderate Sedation:      N/A- Per Anesthesia Care Recommendation:           - Discharge patient to  home.                           - Will begin referral process to [facility]. Procedure Code(s):        --- Professional ---                           [id number], Esophagogastroduodenoscopy, flexible,                            transoral; with transendoscopic ultrasound-guided                            intramural or transmural fine needle                            aspiration/biopsy(s), (includes endoscopic                            ultrasound examination limited to the esophagus,                            stomach or duodenum, and adjacent structures) Diagnosis Code(s):        --- Professional ---                           K31.89, Other diseases of stomach and duodenum                           R93.3, Abnormal findings on diagnostic imaging of                            other parts of digestive tract CPT copyright 2016 [employer]. All rights reserved. The codes documented in this report are preliminary and upon coder review may  be revised to meet current compliance requirements. Milus Banister, MD 04/08/2017 1:50:12 PM This report has been signed electronically. Number of Addenda: 0

## 2017-04-09 ENCOUNTER — Encounter (HOSPITAL_COMMUNITY): Payer: Self-pay | Admitting: Gastroenterology

## 2017-05-13 ENCOUNTER — Ambulatory Visit: Payer: Self-pay | Admitting: General Surgery

## 2017-05-13 DIAGNOSIS — C49A2 Gastrointestinal stromal tumor of stomach: Secondary | ICD-10-CM | POA: Diagnosis not present

## 2017-05-21 NOTE — Patient Instructions (Addendum)
MIRAL HOOPES  05/21/2017   Your procedure is scheduled on: 06-01-17  Report to [facility] Main  Entrance    Report to admitting at 8:45AM   Call this number if you have problems the morning of surgery [phone]   Remember: ONLY 1 PERSON MAY GO WITH YOU TO SHORT STAY TO GET  READY MORNING OF YOUR SURGERY.    NO SOLIDS AFTER MIDNIGHT. PLEASE FINISH  CARBOHYDRATE DRINK PER YOUR SURGEONS ORDERS  BY 7:45AM DAY OF SURGERY BEFORE !    Take these medicines the morning of surgery with A SIP OF WATER: SYNTHROID                                 You may not have any metal on your body including hair pins and              piercings  Do not wear jewelry, make-up, lotions, powders or perfumes, deodorant             Do not wear nail polish.  Do not shave  48 hours prior to surgery.     .   Do not bring valuables to the hospital. [place].  Contacts, dentures or bridgework may not be worn into surgery.  Leave suitcase in the car. After surgery it may be brought to your room.                Please read over the following fact sheets you were given: _____________________________________________________________________            [facility] - Preparing for Surgery Before surgery, you can play an important role.  Because skin is not sterile, your skin needs to be as free of germs as possible.  You can reduce the number of germs on your skin by washing with CHG (chlorahexidine gluconate) soap before surgery.  CHG is an antiseptic cleaner which kills germs and bonds with the skin to continue killing germs even after washing. Please DO NOT use if you have an allergy to CHG or antibacterial soaps.  If your skin becomes reddened/irritated stop using the CHG and inform your nurse when you arrive at Short Stay. Do not shave (including legs and underarms) for at least 48 hours prior to the first CHG shower.  You may shave your  face/neck. Please follow these instructions carefully:  1.  Shower with CHG Soap the night before surgery and the  morning of Surgery.  2.  If you choose to wash your hair, wash your hair first as usual with your  normal  shampoo.  3.  After you shampoo, rinse your hair and body thoroughly to remove the  shampoo.                           4.  Use CHG as you would any other liquid soap.  You can apply chg directly  to the skin and wash                       Gently with a scrungie or clean washcloth.  5.  Apply the CHG Soap to your body ONLY FROM THE NECK DOWN.  Do not use on face/ open                           Wound or open sores. Avoid contact with eyes, ears mouth and genitals (private parts).                       Wash face,  Genitals (private parts) with your normal soap.             6.  Wash thoroughly, paying special attention to the area where your surgery  will be performed.  7.  Thoroughly rinse your body with warm water from the neck down.  8.  DO NOT shower/wash with your normal soap after using and rinsing off  the CHG Soap.                9.  Pat yourself dry with a clean towel.            10.  Wear clean pajamas.            11.  Place clean sheets on your bed the night of your first shower and do not  sleep with pets. Day of Surgery : Do not apply any lotions/deodorants the morning of surgery.  Please wear clean clothes to the hospital/surgery center.  FAILURE TO FOLLOW THESE INSTRUCTIONS MAY RESULT IN THE CANCELLATION OF YOUR SURGERY PATIENT SIGNATURE_________________________________  NURSE SIGNATURE__________________________________  ________________________________________________________________________

## 2017-05-24 ENCOUNTER — Other Ambulatory Visit: Payer: Self-pay

## 2017-05-24 ENCOUNTER — Encounter (HOSPITAL_COMMUNITY)
Admission: RE | Admit: 2017-05-24 | Discharge: 2017-05-24 | Disposition: A | Discharge status: Home / Self Care | Payer: Federal, State, Local not specified - PPO | Source: Ambulatory Visit | Attending: General Surgery | Admitting: General Surgery

## 2017-05-24 ENCOUNTER — Encounter (HOSPITAL_COMMUNITY): Payer: Self-pay

## 2017-05-24 DIAGNOSIS — Z01812 Encounter for preprocedural laboratory examination: Secondary | ICD-10-CM | POA: Insufficient documentation

## 2017-05-24 DIAGNOSIS — Z780 Asymptomatic menopausal state: Secondary | ICD-10-CM | POA: Insufficient documentation

## 2017-05-24 DIAGNOSIS — D490 Neoplasm of unspecified behavior of digestive system: Secondary | ICD-10-CM | POA: Diagnosis not present

## 2017-05-24 HISTORY — DX: Personal history of malignant neoplasm of other digestive organs: Z85.09

## 2017-05-24 LAB — BASIC METABOLIC PANEL
ANION GAP: 6 (ref 5–15)
BUN: 16 mg/dL (ref 6–20)
CHLORIDE: 106 mmol/L (ref 101–111)
CO2: 27 mmol/L (ref 22–32)
Calcium: 10.1 mg/dL (ref 8.9–10.3)
Creatinine, Ser: 0.86 mg/dL (ref 0.44–1.00)
Glucose, Bld: 108 mg/dL — ABNORMAL HIGH (ref 65–99)
POTASSIUM: 4.4 mmol/L (ref 3.5–5.1)
SODIUM: 139 mmol/L (ref 135–145)

## 2017-05-24 LAB — CBC
HCT: 38.7 % (ref 36.0–46.0)
HEMOGLOBIN: 12.6 g/dL (ref 12.0–15.0)
MCH: 30.9 pg (ref 26.0–34.0)
MCHC: 32.6 g/dL (ref 30.0–36.0)
MCV: 94.9 fL (ref 78.0–100.0)
Platelets: 297 10*3/uL (ref 150–400)
RBC: 4.08 MIL/uL (ref 3.87–5.11)
RDW: 12.8 % (ref 11.5–15.5)
WBC: 6.6 10*3/uL (ref 4.0–10.5)

## 2017-06-01 ENCOUNTER — Inpatient Hospital Stay (HOSPITAL_COMMUNITY): Payer: Federal, State, Local not specified - PPO | Admitting: Anesthesiology

## 2017-06-01 ENCOUNTER — Encounter (HOSPITAL_COMMUNITY): Payer: Self-pay | Admitting: Anesthesiology

## 2017-06-01 ENCOUNTER — Encounter (HOSPITAL_COMMUNITY)
Admission: RE | Disposition: A | Discharge status: Home / Self Care | Payer: Self-pay | Source: Ambulatory Visit | Attending: General Surgery

## 2017-06-01 ENCOUNTER — Inpatient Hospital Stay (HOSPITAL_COMMUNITY)
Admission: RE | Admit: 2017-06-01 | Discharge: 2017-06-02 | DRG: 544 | Disposition: A | Discharge status: Home / Self Care | Payer: Federal, State, Local not specified - PPO | Source: Ambulatory Visit | Attending: General Surgery | Admitting: General Surgery

## 2017-06-01 ENCOUNTER — Other Ambulatory Visit: Payer: Self-pay

## 2017-06-01 DIAGNOSIS — M25511 Pain in right shoulder: Secondary | ICD-10-CM | POA: Diagnosis not present

## 2017-06-01 DIAGNOSIS — C49A2 Gastrointestinal stromal tumor of stomach: Secondary | ICD-10-CM | POA: Diagnosis not present

## 2017-06-01 DIAGNOSIS — E079 Disorder of thyroid, unspecified: Secondary | ICD-10-CM | POA: Diagnosis not present

## 2017-06-01 DIAGNOSIS — M21611 Bunion of right foot: Secondary | ICD-10-CM | POA: Diagnosis not present

## 2017-06-01 DIAGNOSIS — Z8261 Family history of arthritis: Secondary | ICD-10-CM | POA: Diagnosis not present

## 2017-06-01 DIAGNOSIS — N83209 Unspecified ovarian cyst, unspecified side: Secondary | ICD-10-CM | POA: Diagnosis not present

## 2017-06-01 DIAGNOSIS — Z79899 Other long term (current) drug therapy: Secondary | ICD-10-CM

## 2017-06-01 DIAGNOSIS — M7711 Lateral epicondylitis, right elbow: Secondary | ICD-10-CM | POA: Diagnosis not present

## 2017-06-01 DIAGNOSIS — D490 Neoplasm of unspecified behavior of digestive system: Secondary | ICD-10-CM | POA: Diagnosis not present

## 2017-06-01 DIAGNOSIS — Z91018 Allergy to other foods: Secondary | ICD-10-CM | POA: Diagnosis not present

## 2017-06-01 DIAGNOSIS — Z808 Family history of malignant neoplasm of other organs or systems: Secondary | ICD-10-CM | POA: Diagnosis not present

## 2017-06-01 DIAGNOSIS — Z79890 Hormone replacement therapy (postmenopausal): Secondary | ICD-10-CM

## 2017-06-01 DIAGNOSIS — Z8249 Family history of ischemic heart disease and other diseases of the circulatory system: Secondary | ICD-10-CM | POA: Diagnosis not present

## 2017-06-01 HISTORY — PX: LAPAROSCOPIC GASTRIC RESECTION: SHX1936

## 2017-06-01 SURGERY — LAPAROSCOPIC GASTRIC RESECTION
Anesthesia: General | Site: Abdomen

## 2017-06-01 MED ORDER — ONDANSETRON 4 MG PO TBDP: 4.0000 mg | ORAL_TABLET | Freq: Four times a day (QID) | ORAL | Status: DC | PRN

## 2017-06-01 MED ORDER — LIDOCAINE HCL 2 % IJ SOLN
INTRAMUSCULAR | Status: AC
  Filled 2017-06-01: qty 20

## 2017-06-01 MED ORDER — PROMETHAZINE HCL 25 MG/ML IJ SOLN: 6.2500 mg | INTRAMUSCULAR | Status: DC | PRN

## 2017-06-01 MED ORDER — MORPHINE SULFATE (PF) 2 MG/ML IV SOLN: 2.0000 mg | INTRAVENOUS | Status: DC | PRN

## 2017-06-01 MED ORDER — MEPERIDINE HCL 50 MG/ML IJ SOLN: 6.2500 mg | INTRAMUSCULAR | Status: DC | PRN

## 2017-06-01 MED ORDER — CHLORHEXIDINE GLUCONATE CLOTH 2 % EX PADS: 6.0000 | MEDICATED_PAD | Freq: Once | CUTANEOUS | Status: DC

## 2017-06-01 MED ORDER — CELECOXIB 200 MG PO CAPS
200.0000 mg | ORAL_CAPSULE | ORAL | Status: AC
  Administered 2017-06-01: 200 mg via ORAL
  Filled 2017-06-01: qty 1

## 2017-06-01 MED ORDER — HYDROCODONE-ACETAMINOPHEN 7.5-325 MG PO TABS: 1.0000 | ORAL_TABLET | Freq: Once | ORAL | Status: DC | PRN

## 2017-06-01 MED ORDER — MIDAZOLAM HCL 5 MG/5ML IJ SOLN
INTRAMUSCULAR | Status: DC | PRN
  Administered 2017-06-01: 2 mg via INTRAVENOUS

## 2017-06-01 MED ORDER — ALBUMIN HUMAN 5 % IV SOLN
INTRAVENOUS | Status: AC
  Filled 2017-06-01: qty 500

## 2017-06-01 MED ORDER — ROCURONIUM BROMIDE 50 MG/5ML IV SOSY
PREFILLED_SYRINGE | INTRAVENOUS | Status: AC
  Filled 2017-06-01: qty 5

## 2017-06-01 MED ORDER — PROPOFOL 10 MG/ML IV BOLUS
INTRAVENOUS | Status: AC
  Filled 2017-06-01: qty 20

## 2017-06-01 MED ORDER — ONDANSETRON HCL 4 MG/2ML IJ SOLN: 4.0000 mg | Freq: Four times a day (QID) | INTRAMUSCULAR | Status: DC | PRN

## 2017-06-01 MED ORDER — FENTANYL CITRATE (PF) 100 MCG/2ML IJ SOLN
INTRAMUSCULAR | Status: AC
  Filled 2017-06-01: qty 2

## 2017-06-01 MED ORDER — CELECOXIB 200 MG PO CAPS
200.0000 mg | ORAL_CAPSULE | Freq: Two times a day (BID) | ORAL | Status: DC
  Administered 2017-06-01 – 2017-06-02 (×2): 200 mg via ORAL
  Filled 2017-06-01 (×3): qty 1

## 2017-06-01 MED ORDER — EVICEL 5 ML EX KIT
PACK | Freq: Once | CUTANEOUS | Status: DC
  Filled 2017-06-01: qty 1

## 2017-06-01 MED ORDER — ENOXAPARIN SODIUM 40 MG/0.4ML ~~LOC~~ SOLN
40.0000 mg | SUBCUTANEOUS | Status: DC
  Administered 2017-06-02: 40 mg via SUBCUTANEOUS
  Filled 2017-06-01: qty 0.4

## 2017-06-01 MED ORDER — EVICEL 5 ML EX KIT
PACK | CUTANEOUS | Status: DC | PRN
  Administered 2017-06-01: 1

## 2017-06-01 MED ORDER — ROCURONIUM BROMIDE 10 MG/ML (PF) SYRINGE
PREFILLED_SYRINGE | INTRAVENOUS | Status: DC | PRN
  Administered 2017-06-01: 20 mg via INTRAVENOUS
  Administered 2017-06-01: 10 mg via INTRAVENOUS
  Administered 2017-06-01: 5 mg via INTRAVENOUS
  Administered 2017-06-01: 50 mg via INTRAVENOUS

## 2017-06-01 MED ORDER — 0.9 % SODIUM CHLORIDE (POUR BTL) OPTIME
TOPICAL | Status: DC | PRN
  Administered 2017-06-01: 1000 mL

## 2017-06-01 MED ORDER — ACETAMINOPHEN 500 MG PO TABS
1000.0000 mg | ORAL_TABLET | ORAL | Status: AC
  Administered 2017-06-01: 1000 mg via ORAL
  Filled 2017-06-01: qty 2

## 2017-06-01 MED ORDER — MIDAZOLAM HCL 2 MG/2ML IJ SOLN
INTRAMUSCULAR | Status: AC
  Filled 2017-06-01: qty 2

## 2017-06-01 MED ORDER — DEXAMETHASONE SODIUM PHOSPHATE 10 MG/ML IJ SOLN
INTRAMUSCULAR | Status: DC | PRN
  Administered 2017-06-01: 10 mg via INTRAVENOUS

## 2017-06-01 MED ORDER — ONDANSETRON HCL 4 MG/2ML IJ SOLN
INTRAMUSCULAR | Status: DC | PRN
  Administered 2017-06-01: 4 mg via INTRAVENOUS

## 2017-06-01 MED ORDER — PANTOPRAZOLE SODIUM 40 MG IV SOLR
40.0000 mg | Freq: Every day | INTRAVENOUS | Status: DC
  Administered 2017-06-01: 40 mg via INTRAVENOUS
  Filled 2017-06-01 (×2): qty 40

## 2017-06-01 MED ORDER — DEXTROSE IN LACTATED RINGERS 5 % IV SOLN
INTRAVENOUS | Status: DC
  Administered 2017-06-01 – 2017-06-02 (×2): via INTRAVENOUS

## 2017-06-01 MED ORDER — GABAPENTIN 300 MG PO CAPS
300.0000 mg | ORAL_CAPSULE | ORAL | Status: AC
  Administered 2017-06-01: 300 mg via ORAL
  Filled 2017-06-01: qty 1

## 2017-06-01 MED ORDER — LIDOCAINE 2% (20 MG/ML) 5 ML SYRINGE
INTRAMUSCULAR | Status: DC | PRN
  Administered 2017-06-01: 1 mg/kg/h via INTRAVENOUS

## 2017-06-01 MED ORDER — BUPIVACAINE-EPINEPHRINE 0.25% -1:200000 IJ SOLN
INTRAMUSCULAR | Status: AC
  Filled 2017-06-01: qty 1

## 2017-06-01 MED ORDER — PROPOFOL 10 MG/ML IV BOLUS
INTRAVENOUS | Status: DC | PRN
  Administered 2017-06-01: 130 mg via INTRAVENOUS

## 2017-06-01 MED ORDER — CEFAZOLIN SODIUM-DEXTROSE 2-4 GM/100ML-% IV SOLN
2.0000 g | INTRAVENOUS | Status: AC
  Administered 2017-06-01: 2 g via INTRAVENOUS
  Filled 2017-06-01: qty 100

## 2017-06-01 MED ORDER — BUPIVACAINE-EPINEPHRINE 0.25% -1:200000 IJ SOLN
INTRAMUSCULAR | Status: DC | PRN
  Administered 2017-06-01: 30 mL

## 2017-06-01 MED ORDER — BUPIVACAINE LIPOSOME 1.3 % IJ SUSP
20.0000 mL | Freq: Once | INTRAMUSCULAR | Status: AC
  Administered 2017-06-01: 20 mL
  Filled 2017-06-01: qty 20

## 2017-06-01 MED ORDER — ACETAMINOPHEN 10 MG/ML IV SOLN: 1000.0000 mg | Freq: Once | INTRAVENOUS | Status: DC | PRN

## 2017-06-01 MED ORDER — SUGAMMADEX SODIUM 200 MG/2ML IV SOLN
INTRAVENOUS | Status: AC
  Filled 2017-06-01: qty 2

## 2017-06-01 MED ORDER — OXYCODONE HCL 5 MG PO TABS: 5.0000 mg | ORAL_TABLET | ORAL | Status: DC | PRN

## 2017-06-01 MED ORDER — TRAMADOL HCL 50 MG PO TABS
50.0000 mg | ORAL_TABLET | Freq: Four times a day (QID) | ORAL | Status: DC | PRN
  Administered 2017-06-01 – 2017-06-02 (×3): 50 mg via ORAL
  Filled 2017-06-01 (×3): qty 1

## 2017-06-01 MED ORDER — EPHEDRINE SULFATE-NACL 50-0.9 MG/10ML-% IV SOSY
PREFILLED_SYRINGE | INTRAVENOUS | Status: DC | PRN
  Administered 2017-06-01 (×2): 10 mg via INTRAVENOUS

## 2017-06-01 MED ORDER — SUGAMMADEX SODIUM 200 MG/2ML IV SOLN
INTRAVENOUS | Status: DC | PRN
  Administered 2017-06-01: 150 mg via INTRAVENOUS

## 2017-06-01 MED ORDER — ONDANSETRON HCL 4 MG/2ML IJ SOLN: 4.0000 mg | Freq: Once | INTRAMUSCULAR | Status: DC | PRN

## 2017-06-01 MED ORDER — ONDANSETRON HCL 4 MG/2ML IJ SOLN
INTRAMUSCULAR | Status: AC
  Filled 2017-06-01: qty 2

## 2017-06-01 MED ORDER — LEVOTHYROXINE SODIUM 75 MCG PO TABS
75.0000 ug | ORAL_TABLET | Freq: Every day | ORAL | Status: DC
  Administered 2017-06-02: 75 ug via ORAL
  Filled 2017-06-01: qty 1

## 2017-06-01 MED ORDER — GABAPENTIN 300 MG PO CAPS
300.0000 mg | ORAL_CAPSULE | Freq: Two times a day (BID) | ORAL | Status: DC
  Administered 2017-06-01 – 2017-06-02 (×2): 300 mg via ORAL
  Filled 2017-06-01 (×3): qty 1

## 2017-06-01 MED ORDER — EPHEDRINE 5 MG/ML INJ
INTRAVENOUS | Status: AC
  Filled 2017-06-01: qty 10

## 2017-06-01 MED ORDER — HYDROMORPHONE HCL 1 MG/ML IJ SOLN
INTRAMUSCULAR | Status: AC
  Filled 2017-06-01: qty 1

## 2017-06-01 MED ORDER — HYDROMORPHONE HCL 1 MG/ML IJ SOLN
0.2500 mg | INTRAMUSCULAR | Status: DC | PRN
  Administered 2017-06-01 (×2): 0.5 mg via INTRAVENOUS

## 2017-06-01 MED ORDER — LACTATED RINGERS IV SOLN
INTRAVENOUS | Status: DC
Start: 2017-06-01 — End: 2017-06-01
  Administered 2017-06-01 (×3): via INTRAVENOUS

## 2017-06-01 MED ORDER — FENTANYL CITRATE (PF) 100 MCG/2ML IJ SOLN
INTRAMUSCULAR | Status: DC | PRN
  Administered 2017-06-01: 25 ug via INTRAVENOUS
  Administered 2017-06-01: 100 ug via INTRAVENOUS
  Administered 2017-06-01: 25 ug via INTRAVENOUS

## 2017-06-01 MED ORDER — LIDOCAINE 2% (20 MG/ML) 5 ML SYRINGE
INTRAMUSCULAR | Status: DC | PRN
  Administered 2017-06-01: 100 mg via INTRAVENOUS

## 2017-06-01 SURGICAL SUPPLY — 71 items
ADH SKN CLS APL DERMABOND .7 (GAUZE/BANDAGES/DRESSINGS) ×1
APPLIER CLIP ROT 10 11.4 M/L (STAPLE)
APPLIER CLIP UNV 5X34 EPIX (ENDOMECHANICALS) ×1 IMPLANT
APR CLP MED LRG 11.4X10 (STAPLE)
APR XCLPCLP 20M/L UNV 34X5 (ENDOMECHANICALS) ×1
BAG SPEC RTRVL 10 TROC 200 (ENDOMECHANICALS) ×1
BAG SPEC RTRVL LRG 6X4 10 (ENDOMECHANICALS)
BLADE HEX COATED 2.75 (ELECTRODE) ×2 IMPLANT
CLIP APPLIE ROT 10 11.4 M/L (STAPLE) IMPLANT
COVER MAYO STAND STRL (DRAPES) ×2 IMPLANT
COVER SURGICAL LIGHT HANDLE (MISCELLANEOUS) ×2 IMPLANT
DERMABOND ADVANCED (GAUZE/BANDAGES/DRESSINGS) ×1
DERMABOND ADVANCED .7 DNX12 (GAUZE/BANDAGES/DRESSINGS) IMPLANT
DRAPE LAPAROSCOPIC ABDOMINAL (DRAPES) ×2 IMPLANT
DRAPE WARM FLUID 44X44 (DRAPE) ×2 IMPLANT
ELECT PENCIL ROCKER SW 15FT (MISCELLANEOUS) ×2 IMPLANT
ELECT REM PT RETURN 15FT ADLT (MISCELLANEOUS) ×2 IMPLANT
ENDOLOOP SUT PDS II  0 18 (SUTURE)
ENDOLOOP SUT PDS II 0 18 (SUTURE) IMPLANT
GAUZE SPONGE 4X4 12PLY STRL (GAUZE/BANDAGES/DRESSINGS) ×2 IMPLANT
GLOVE BIO SURGEON STRL SZ7 (GLOVE) ×2 IMPLANT
GLOVE BIOGEL PI IND STRL 7.0 (GLOVE) ×1 IMPLANT
GLOVE BIOGEL PI IND STRL 7.5 (GLOVE) ×1 IMPLANT
GLOVE BIOGEL PI INDICATOR 7.0 (GLOVE) ×1
GLOVE BIOGEL PI INDICATOR 7.5 (GLOVE) ×1
GOWN STRL REUS W/ TWL XL LVL3 (GOWN DISPOSABLE) ×1 IMPLANT
GOWN STRL REUS W/TWL LRG LVL3 (GOWN DISPOSABLE) ×4 IMPLANT
GOWN STRL REUS W/TWL XL LVL3 (GOWN DISPOSABLE) ×4 IMPLANT
KIT BASIN OR (CUSTOM PROCEDURE TRAY) ×2 IMPLANT
KIT DEFENDO BUTTON (KITS) ×1 IMPLANT
MARKER SKIN DUAL TIP RULER LAB (MISCELLANEOUS) IMPLANT
NS IRRIG 1000ML POUR BTL (IV SOLUTION) ×4 IMPLANT
POUCH RETRIEVAL ECOSAC 10 (ENDOMECHANICALS) IMPLANT
POUCH RETRIEVAL ECOSAC 10MM (ENDOMECHANICALS) ×1
POUCH SPECIMEN RETRIEVAL 10MM (ENDOMECHANICALS) IMPLANT
RELOAD STAPLE 60 4.1 GRN THCK (STAPLE) IMPLANT
RELOAD STAPLER GREEN 60MM (STAPLE) ×2 IMPLANT
SCISSORS LAP 5X35 DISP (ENDOMECHANICALS) ×2 IMPLANT
SEALER TISSUE X1 CVD JAW (INSTRUMENTS) IMPLANT
SET IRRIG TUBING LAPAROSCOPIC (IRRIGATION / IRRIGATOR) ×2 IMPLANT
SHEARS HARMONIC ACE PLUS 36CM (ENDOMECHANICALS) IMPLANT
SPONGE LAP 18X18 X RAY DECT (DISPOSABLE) ×2 IMPLANT
STAPLE ECHEON FLEX 60 POW ENDO (STAPLE) ×1 IMPLANT
STAPLER 90 3.5 STAND SLIM (STAPLE)
STAPLER 90 3.5 STD SLIM (STAPLE) IMPLANT
STAPLER RELOAD GREEN 60MM (STAPLE) ×4
STAPLER VISISTAT 35W (STAPLE) ×2 IMPLANT
SUT MNCRL AB 4-0 PS2 18 (SUTURE) IMPLANT
SUT PDS AB 0 CT1 36 (SUTURE) ×2 IMPLANT
SUT SILK 2 0 (SUTURE) ×2
SUT SILK 2 0 SH CR/8 (SUTURE) ×2 IMPLANT
SUT SILK 2-0 18XBRD TIE 12 (SUTURE) ×1 IMPLANT
SUT SILK 3 0 (SUTURE)
SUT SILK 3 0 SH CR/8 (SUTURE) IMPLANT
SUT SILK 3-0 18XBRD TIE 12 (SUTURE) IMPLANT
SYRINGE 60CC LL (MISCELLANEOUS) ×1 IMPLANT
TIP INNERVISION DETACH 40FR (MISCELLANEOUS) IMPLANT
TIP INNERVISION DETACH 50FR (MISCELLANEOUS) IMPLANT
TIP INNERVISION DETACH 56FR (MISCELLANEOUS) IMPLANT
TIP RIGID 35CM EVICEL (HEMOSTASIS) ×1 IMPLANT
TIPS INNERVISION DETACH 40FR (MISCELLANEOUS) ×2
TOWEL OR 17X26 10 PK STRL BLUE (TOWEL DISPOSABLE) ×4 IMPLANT
TRAY FOLEY W/METER SILVER 16FR (SET/KITS/TRAYS/PACK) ×2 IMPLANT
TRAY LAPAROSCOPIC (CUSTOM PROCEDURE TRAY) ×2 IMPLANT
TROCAR XCEL BLUNT TIP 100MML (ENDOMECHANICALS) IMPLANT
TROCAR XCEL NON-BLD 11X100MML (ENDOMECHANICALS) IMPLANT
TROCAR XCEL UNIV SLVE 11M 100M (ENDOMECHANICALS) IMPLANT
TUBING CONNECTING 10 (TUBING) ×2 IMPLANT
TUBING ENDO SMARTCAP PENTAX (MISCELLANEOUS) ×1 IMPLANT
TUBING INSUF HEATED (TUBING) ×2 IMPLANT
YANKAUER SUCT BULB TIP 10FT TU (MISCELLANEOUS) ×2 IMPLANT

## 2017-06-01 NOTE — H&P (Signed)
istory of Present Illness Carrie Allison T. Carrie Ogletree MD; 05/13/2017 10:31 AM) The patient is a 57 year old female who presents with an abdominal mass. Patient is a very pleasant 57 year old female patient of Dr. Ardis Hughs and Dr. Brigitte Pulse referred for an apparent GIST of the stomach. Recently the patient complained of some lower abdominal pressure and discomfort. An ultrasound was obtained which was unremarkable and subsequently she was referred to GYN. She had an ultrasound apparently showing a small ovarian cyst without treatment planned and also a CT scan of the abdomen and pelvis was obtained. The significant finding here and I have reviewed the films personally RA fairly large solid mass along the posterior wall of the stomach at the fundus measuring 4.3 cm in greatest diameter. This abuts the hilum of the spleen and splenic vessels. Patient sexually underwent upper endoscopy by Dr. Ardis Hughs revealing a submucosal mass in that area. It was hypoechoic on ultrasound, heterogeneous and communicating with the muscular layer of the gastric wall. It was noted to abut the splenic hilum. Fine-needle aspiration was performed transgastric with pathology showing a spindle cell neoplasm consistent with GIST. The patient has had resolution of her lower abdominal discomfort. She recently has noted some increased belching. No abdominal pain. No evidence of bleeding. No nausea or vomiting.   Past Surgical History (Carrie Allison, [place]; 05/13/2017 9:25 AM) Oral Surgery   Diagnostic Studies History (Carrie Allison, [place]; 05/13/2017 9:25 AM) Colonoscopy  1-5 years ago Mammogram  within last year Pap Smear  1-5 years ago  Allergies (Carrie Allison, [place]; 05/13/2017 9:25 AM) No Known Drug Allergies [05/13/2017]: Allergies Reconciled   Medication History (Carrie Allison, [place]; 05/13/2017 9:26 AM) Synthroid (75MCG Tablet, Oral) Active. Calcium (Oral) Specific strength unknown - Active. Multiple Vitamin  (Oral) Active. Red Yeast Rice (Oral) Specific strength unknown - Active. Medications Reconciled  Social History (Carrie Allison, [place]; 05/13/2017 9:25 AM) Alcohol use  Occasional alcohol use. Caffeine use  Coffee. No drug use  Tobacco use  Never smoker.  Family History (Carrie Allison, [place]; 05/13/2017 9:25 AM) Arthritis  Mother. Hypertension  Mother. Melanoma  Father. Respiratory Condition  Father. Thyroid problems  Sister.  Pregnancy / Birth History (Carrie Allison, [place]; 05/13/2017 9:25 AM) Age at menarche  4 years. Age of menopause  [age] Contraceptive History  Oral contraceptives. Gravida  1 Irregular periods  Maternal age  58-35 Para  1  Other Problems (Carrie Allison, [place]; 05/13/2017 9:25 AM) Thyroid Disease     Review of Systems (Carrie A. Carrie Allison; 05/13/2017 9:25 AM) General Not Present- Appetite Loss, Chills, Fatigue, Fever, Night Sweats, Weight Gain and Weight Loss. Skin Not Present- Change in Wart/Mole, Dryness, Hives, Jaundice, New Lesions, Non-Healing Wounds, Rash and Ulcer. HEENT Present- Wears glasses/contact lenses. Not Present- Earache, Hearing Loss, Hoarseness, Nose Bleed, Oral Ulcers, Ringing in the Ears, Seasonal Allergies, Sinus Pain, Sore Throat, Visual Disturbances and Yellow Eyes. Respiratory Not Present- Bloody sputum, Chronic Cough, Difficulty Breathing, Snoring and Wheezing. Breast Not Present- Breast Mass, Breast Pain, Nipple Discharge and Skin Changes. Cardiovascular Not Present- Chest Pain, Difficulty Breathing Lying Down, Leg Cramps, Palpitations, Rapid Heart Rate, Shortness of Breath and Swelling of Extremities. Gastrointestinal Not Present- Abdominal Pain, Bloating, Bloody Stool, Change in Bowel Habits, Chronic diarrhea, Constipation, Difficulty Swallowing, Excessive gas, Gets full quickly at meals, Hemorrhoids, Indigestion, Nausea, Rectal Pain and Vomiting. Female Genitourinary Not Present- Frequency, Nocturia,  Painful Urination, Pelvic Pain and Urgency. Musculoskeletal Not Present- Back Pain, Joint Pain, Joint  Stiffness, Muscle Pain, Muscle Weakness and Swelling of Extremities. Neurological Not Present- Decreased Memory, Fainting, Headaches, Numbness, Seizures, Tingling, Tremor, Trouble walking and Weakness. Psychiatric Not Present- Anxiety, Bipolar, Change in Sleep Pattern, Depression, Fearful and Frequent crying. Endocrine Not Present- Cold Intolerance, Excessive Hunger, Hair Changes, Heat Intolerance, Hot flashes and New Diabetes. Hematology Not Present- Blood Thinners, Easy Bruising, Excessive bleeding, Gland problems, HIV and Persistent Infections.  Vitals (Carrie A. Carrie Allison; 05/13/2017 9:25 AM) 05/13/2017 9:25 AM Weight: 142 lb Height: 66in Body Surface Area: 1.73 m Body Mass Index: 22.92 kg/m  Temp.: 97.20F  Pulse: 92 (Regular)  BP: 128/84 (Sitting, Left Arm, Standard)       Physical Exam Carrie Allison T. Daneille Desilva MD; 05/13/2017 10:31 AM) The physical exam findings are as follows: Note:General: Alert, thin healthy-appearing Caucasian female in no distress Skin: Warm and dry without rash or infection. HEENT: No palpable masses or thyromegaly. Sclera nonicteric. Pupils equal round and reactive. Lymph nodes: No cervical, supraclavicular, or inguinal nodes palpable. Lungs: Breath sounds clear and equal. No wheezing or increased work of breathing. Cardiovascular: Regular rate and rhythm without murmer. No JVD or edema. Peripheral pulses intact. No carotid bruits. Abdomen: Nondistended. Soft and nontender. No masses palpable. No organomegaly. No palpable hernias. Extremities: No edema or joint swelling or deformity. No chronic venous stasis changes. Neurologic: Alert and fully oriented. Gait normal. No focal weakness. Psychiatric: Normal mood and affect. Thought content appropriate with normal judgement and insight    Assessment & Plan Carrie Allison T. 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 It is in the posterior fundus abutting the spleen. I discussed the diagnosis with the patient and her husband. We discussed that these tumors have a range of benign to potentially malignant behavior. This certainly needs to be removed. We discussed that follow-up treatment could be indicated depending on final pathology.  I recommended laparoscopic excision. We discussed the nature of surgery. We discussed that open procedure or potentially splenectomy could be required. Discussed risks of anesthetic complications, bleeding, infection and risk of recurrence. All their questions were answered and she would like to proceed. Current Plans Schedule for Surgery  Laparoscopic and possible open resection of GIST of stomach

## 2017-06-01 NOTE — Anesthesia Procedure Notes (Signed)
Procedure Name: Intubation Date/Time: 06/01/2017 12:03 PM Performed by: Lavina Hamman, CRNA Pre-anesthesia Checklist: Patient identified, Emergency Drugs available, Suction available, Patient being monitored and Timeout performed Patient Re-evaluated:Patient Re-evaluated prior to induction Oxygen Delivery Method: Circle system utilized Preoxygenation: Pre-oxygenation with 100% oxygen Induction Type: IV induction Ventilation: Mask ventilation without difficulty Laryngoscope Size: Mac and 4 Grade View: Grade II Tube type: Oral Tube size: 7.0 mm Number of attempts: 1 Airway Equipment and Method: Stylet Placement Confirmation: ETT inserted through vocal cords under direct vision,  positive ETCO2,  CO2 detector and breath sounds checked- equal and bilateral Secured at: 21 cm Tube secured with: Tape Dental Injury: Teeth and Oropharynx as per pre-operative assessment

## 2017-06-01 NOTE — Interval H&P Note (Signed)
History and Physical Interval Note:  06/01/2017 11:37 AM  Carrie Allison  has presented today for surgery, with the diagnosis of gist of stomach  The various methods of treatment have been discussed with the patient and family. After consideration of risks, benefits and other options for treatment, the patient has consented to  Procedure(s): LAPAROSCOPIC RESECTION GASTRIC of GIST (N/A) as a surgical intervention .  The patient's history has been reviewed, patient examined, no change in status, stable for surgery.  I have reviewed the patient's chart and labs.  Questions were answered to the patient's satisfaction.     Darene Lamer Shaka Zech

## 2017-06-01 NOTE — Anesthesia Preprocedure Evaluation (Signed)
Anesthesia Evaluation  Patient identified by MRN, date of birth, ID band Patient awake    Reviewed: Allergy & Precautions, NPO status , Patient's Chart, lab work & pertinent test results  Airway Mallampati: II  TM Distance: >3 FB Neck ROM: Full    Dental no notable dental hx.    Pulmonary neg pulmonary ROS,    Pulmonary exam normal breath sounds clear to auscultation       Cardiovascular negative cardio ROS Normal cardiovascular exam Rhythm:Regular Rate:Normal     Neuro/Psych negative neurological ROS  negative psych ROS   GI/Hepatic negative GI ROS, Neg liver ROS,   Endo/Other  negative endocrine ROS  Renal/GU negative Renal ROS  negative genitourinary   Musculoskeletal negative musculoskeletal ROS (+)   Abdominal   Peds negative pediatric ROS (+)  Hematology negative hematology ROS (+)   Anesthesia Other Findings    Reproductive/Obstetrics negative OB ROS                             Lab Results  Component Value Date   WBC 6.6 05/24/2017   HGB 12.6 05/24/2017   HCT 38.7 05/24/2017   MCV 94.9 05/24/2017   PLT 297 05/24/2017    Anesthesia Physical Anesthesia Plan  ASA: II  Anesthesia Plan: General   Post-op Pain Management:    Induction: Intravenous  PONV Risk Score and Plan: Treatment may vary due to age or medical condition, Ondansetron, Dexamethasone, Scopolamine patch - Pre-op and Midazolam  Airway Management Planned: Oral ETT  Additional Equipment:   Intra-op Plan:   Post-operative Plan: Extubation in OR  Informed Consent: I have reviewed the patients History and Physical, chart, labs and discussed the procedure including the risks, benefits and alternatives for the proposed anesthesia with the patient or authorized representative who has indicated his/her understanding and acceptance.   Dental advisory given  Plan Discussed with: CRNA  Anesthesia Plan  Comments:         Anesthesia Quick Evaluation

## 2017-06-01 NOTE — Transfer of Care (Signed)
Immediate Anesthesia Transfer of Care Note  Patient: Carrie Allison  Procedure(s) Performed: LAPAROSCOPIC RESECTION GASTRIC OF GIST (N/A Abdomen)  Patient Location: PACU  Anesthesia Type:General  Level of Consciousness: awake, alert  and oriented  Airway & Oxygen Therapy: Patient Spontanous Breathing and Patient connected to face mask oxygen  Post-op Assessment: Report given to RN  Post vital signs: Reviewed and stable  Last Vitals:  Vitals:   06/01/17 0848  BP: 111/65  Pulse: 70  Resp: 18  Temp: 36.6 C  SpO2: 100%    Last Pain:  Vitals:   06/01/17 0848  TempSrc: Oral      Patients Stated Pain Goal: 4 (68/37/29 0211)  Complications: No apparent anesthesia complications

## 2017-06-01 NOTE — Op Note (Signed)
Preoperative Diagnosis: GIST of stomach  Postoprative Diagnosis: GIST of stomach  Procedure: Procedure(s): LAPAROSCOPIC RESECTION GASTRIC OF GIST   Surgeon: Excell Seltzer T   Assistants: Johnathan Hausen  Anesthesia:  General endotracheal anesthesia  Indications: New diagnosis of apparent GIST of the stomach, which appears to be discovered incidentally and asymptomatic measuring approximately 4.5 cm. It is in the posterior fundus abutting the spleen.  After preoperative discussion regarding indications and risk detailed elsewhere we have elected to proceed with laparoscopic resection.   Procedure Detail: Patient was taken to the operating room, placed in the supine position on the operating table, and general endotracheal anesthesia induced.  She received preoperative IV antibiotics.  PAS were in place.  The abdomen was widely sterilely prepped and draped.  Patient timeout was performed and correct procedure verified.  Access was obtained with a 5 mm Optiview trocar in the left upper quadrant without difficulty and pneumoperitoneum established.  There was no evidence of trocar injury.  Under direct vision a 5 mm trocar was placed laterally in the right upper quadrant, a 15 mm trocar to the right of the umbilicus and a 5 mm trocar to the left of the umbilicus for the camera port.  Through a 5 mm subxiphoid site the [facility] retractor was placed in the left lobe of the liver elevated with excellent exposure of the stomach and the hiatus.  A bilateral T AP block was performed with dilute Exparel.  Beginning at the proximal to mid greater curve the vascular adjacent to the stomach was divided with harmonic scalpel and the lesser sac entered.  The dissection continued proximally dividing short gastric vessels with the harmonic scalpel.  As the fundus was mobilized there was an obvious vascular appearing mass that was very exophytic off the posterior lateral wall of the fundus and lying against the  spleen.  There were no attachments to  the spleen or surrounding structures.  The fundus was completely mobilized up to the angle of Hiss.  At this point the mass could be lifted up and was seen to be attached to a fairly small area of the posterior lateral fundus.  Holding the mass out laterally a 60 mm Echelon green load stapler was placed on the gastric side of the mass with a moderate cuff of normal stomach between the stapler and the mass.  This was clearly not impinging upon the EG junction at all.  This was fired leaving a small amount of tissue remaining superiorly which was divided with a second firing of the stapler.  The mass was placed in a tissue retrieval bag and brought up to the 15 mm trocar site.  This had to be enlarged a couple of centimeters to allow extraction.  The enlarged trocar site was then closed in layers with running 0 PDS.  The left upper quadrant was thoroughly irrigated.  The staple line was intact.  There was some slight oozing that was controlled with clips with complete hemostasis.  Evicel was used to coat the staple line.  At this point there was no bleeding or evidence of trocar injury or other problems.  The [place] retractor was removed.  All CO2 was evacuated and trochars removed.  Again incisions were closed with subcuticular Monocryl and Dermabond.  Sponge needle and instrument counts were correct.    Findings: Above  Estimated Blood Loss:  Minimal         Drains: None  Blood Given: none  Specimens: Portion of stomach with tumor        Complications:  * No complications entered in OR log *         Disposition: PACU - hemodynamically stable.         Condition: stable

## 2017-06-01 NOTE — Anesthesia Postprocedure Evaluation (Signed)
Anesthesia Post Note  Patient: Carrie Allison  Procedure(s) Performed: LAPAROSCOPIC RESECTION GASTRIC OF GIST (N/A Abdomen)     Patient location during evaluation: PACU Anesthesia Type: General Level of consciousness: awake and alert Pain management: pain level controlled Vital Signs Assessment: post-procedure vital signs reviewed and stable Respiratory status: spontaneous breathing, nonlabored ventilation, respiratory function stable and patient connected to nasal cannula oxygen Cardiovascular status: blood pressure returned to baseline and stable Postop Assessment: no apparent nausea or vomiting Anesthetic complications: no    Last Vitals:  Vitals:   06/01/17 1400 06/01/17 1415  BP: (!) 141/71 140/74  Pulse: 62 63  Resp: 12 13  Temp:    SpO2: 100% 100%    Last Pain:  Vitals:   06/01/17 1430  TempSrc:   PainSc: 3                  Barnet Glasgow

## 2017-06-02 ENCOUNTER — Encounter (HOSPITAL_COMMUNITY): Payer: Self-pay | Admitting: General Surgery

## 2017-06-02 LAB — CBC
HEMATOCRIT: 33.2 % — AB (ref 36.0–46.0)
HEMOGLOBIN: 11.1 g/dL — AB (ref 12.0–15.0)
MCH: 31.5 pg (ref 26.0–34.0)
MCHC: 33.4 g/dL (ref 30.0–36.0)
MCV: 94.3 fL (ref 78.0–100.0)
Platelets: 241 10*3/uL (ref 150–400)
RBC: 3.52 MIL/uL — AB (ref 3.87–5.11)
RDW: 13.1 % (ref 11.5–15.5)
WBC: 10.8 10*3/uL — ABNORMAL HIGH (ref 4.0–10.5)

## 2017-06-02 MED ORDER — TRAMADOL HCL 50 MG PO TABS: 50.0000 mg | ORAL_TABLET | Freq: Four times a day (QID) | ORAL | 0 refills | Status: DC | PRN

## 2017-06-02 MED ORDER — OXYCODONE HCL 5 MG PO TABS: 5.0000 mg | ORAL_TABLET | Freq: Four times a day (QID) | ORAL | 0 refills | Status: DC | PRN

## 2017-06-02 NOTE — Progress Notes (Signed)
Patient ID: Carrie Allison, female   DOB: Oct 16, 1960, 57 y.o.   MRN: [id number] 1 Day Post-Op   Subjective: Some intermittent sharp right subcostal and right shoulder pain.  None currently.  No nausea.  Tolerating clear liquids.  Getting up and down to the bathroom without difficulty.  Objective: Vital signs in last 24 hours: Temp:  [97.4 F (36.3 C)-98.6 F (37 C)] 98.2 F (36.8 C) (01/09 0524) Pulse Rate:  [55-77] 63 (01/09 0626) Resp:  [12-20] 18 (01/09 0626) BP: (107-149)/(57-87) 136/74 (01/09 0626) SpO2:  [100 %] 100 % (01/09 0626) Weight:  [56.1 kg (123 lb 9.6 oz)] 56.1 kg (123 lb 9.6 oz) (01/08 0848)    Intake/Output from previous day: 01/08 0701 - 01/09 0700 In: 3115 [P.O.:240; I.V.:2875] Out: 25 [Blood:25] Intake/Output this shift: No intake/output data recorded.  General appearance: alert, cooperative and no distress GI: normal findings: soft, non-tender and Nondistended Incision/Wound: No erythema or drainage  Lab Results:  Recent Labs    06/02/17 0504  WBC 10.8*  HGB 11.1*  HCT 33.2*  PLT 241   BMET No results for input(s): NA, K, CL, CO2, GLUCOSE, BUN, CREATININE, CALCIUM in the last 72 hours.   Studies/Results: No results found.  Anti-infectives: Anti-infectives (From admission, onward)   Start     Dose/Rate Route Frequency Ordered Stop   06/01/17 0855  ceFAZolin (ANCEF) IVPB 2g/100 mL premix     2 g 200 mL/hr over 30 Minutes Intravenous On call to O.R. 06/01/17 0855 06/01/17 1154      Assessment/Plan: s/p Procedure(s): LAPAROSCOPIC RESECTION GASTRIC OF GIST Doing well postoperatively without complication.  Suspect shoulder pain secondary to retained carbon dioxide which should resolve quickly. Okay for discharge today.   LOS: 1 day    Edward Jolly 06/02/2017

## 2017-06-02 NOTE — Discharge Summary (Signed)
  Patient ID: Carrie Allison [id number] 56 y.o. May 01, 1961  06/01/2017  Discharge date and time: 06/02/2017   Admitting Physician: Edward Jolly  Discharge Physician: Edward Jolly  Admission Diagnoses: GIST of stomach  Discharge Diagnoses: Same  Operations: Procedure(s): LAPAROSCOPIC RESECTION OF GIST of stomach  Admission Condition: good  Discharged Condition: good  Indication for Admission: New diagnosis of apparent GIST of the stomach, which appears to be discovered incidentally and asymptomatic measuring approximately 4.5 cm. It is in the posterior fundus abutting the spleen.  After evaluation and discussion in the office detailed elsewhere she is electively admitted for laparoscopic resection   Hospital Course: Patient underwent an uneventful laparoscopic resection of an approximately 4.5 cm mass, exophytic off the fundus of the stomach.  She tolerated the procedure well.  There were no complications.  The following morning she is tolerating a liquid diet without nausea.  Up and about with mild pain.  Vital signs stable.  Hemoglobin stable and exam unremarkable.  She is felt ready for discharge.   Disposition: Home  Patient Instructions:  Allergies as of 06/02/2017      Reactions   Fish Allergy    Stomach upset, diarrhea, ; SALMON  AND TUNA ; MOSTLY OILY FISHES       Medication List    TAKE these medications   CALCIUM PO Take 1,500 mg by mouth daily.   multivitamin with minerals Tabs tablet Take 1 tablet daily by mouth.   oxyCODONE 5 MG immediate release tablet Commonly known as:  Oxy IR/ROXICODONE Take 1 tablet (5 mg total) by mouth every 6 (six) hours as needed for severe pain.   RED YEAST RICE PO Take 2 tablets daily by mouth.   SYNTHROID 75 MCG tablet Generic drug:  levothyroxine Take 75 mcg daily before breakfast by mouth.   traMADol 50 MG tablet Commonly known as:  ULTRAM Take 1 tablet (50 mg total) by mouth every 6 (six) hours as needed  (mild pain).   VITAMIN D PO Take 2,000 Units by mouth daily.   zaleplon 10 MG capsule Commonly known as:  SONATA Take 10 mg at bedtime as needed by mouth for sleep.       Activity: no heavy lifting for 2 weeks Diet: Liquid or pured diet for 1 week then advance as tolerated Wound Care: none needed  Follow-up:  With Dr. Excell Seltzer in 1 month.  Signed: Edward Jolly MD, FACS  06/02/2017, 7:43 AM

## 2017-06-02 NOTE — Discharge Instructions (Signed)
[facility] ______[facility] LAPAROSCOPIC SURGERY: POST OP INSTRUCTIONS Always review your discharge instruction sheet given to you by the facility where your surgery was performed. IF YOU HAVE DISABILITY OR FAMILY LEAVE FORMS, YOU MUST BRING THEM TO THE OFFICE FOR PROCESSING.   DO NOT GIVE THEM TO YOUR DOCTOR.  1. A prescription for pain medication may be given to you upon discharge.  Take your pain medication as prescribed, if needed.  If narcotic pain medicine is not needed, then you may take acetaminophen (Tylenol) or ibuprofen (Advil) as needed. 2. Take your usually prescribed medications unless otherwise directed. 3. If you need a refill on your pain medication, please contact your pharmacy.  They will contact our office to request authorization. Prescriptions will not be filled after 5pm or on week-ends. 4. You should follow a light diet the first few days after arrival home, such as soup and crackers, etc.  Be sure to include lots of fluids daily. 5. Most patients will experience some swelling and bruising in the area of the incisions.  Ice packs will help.  Swelling and bruising can take several days to resolve.  6. It is common to experience some constipation if taking pain medication after surgery.  Increasing fluid intake and taking a stool softener (such as Colace) will usually help or prevent this problem from occurring.  A mild laxative (Milk of Magnesia or Miralax) should be taken according to package instructions if there are no bowel movements after 48 hours. 7. Unless discharge instructions indicate otherwise, you may remove your bandages 24-48 hours after surgery, and you may shower at that time.  You may have steri-strips (small skin tapes) in place directly over the incision.  These strips should be left on the skin for 7-10 days.  If your surgeon used skin glue on the incision, you may shower in 24 hours.  The glue will flake off over the next 2-3 weeks.  Any sutures or  staples will be removed at the office during your follow-up visit. 8. ACTIVITIES:  You may resume regular (light) daily activities beginning the next day--such as daily self-care, walking, climbing stairs--gradually increasing activities as tolerated.  You may have sexual intercourse when it is comfortable.  Refrain from any heavy lifting or straining until approved by your doctor. a. You may drive when you are no longer taking prescription pain medication, you can comfortably wear a seatbelt, and you can safely maneuver your car and apply brakes. b. RETURN TO WORK:  ____1-2 weeks as tolerated______________________________________________________ 9. You should see your doctor in the office for a follow-up appointment approximately 2-3 weeks after your surgery.  Make sure that you call for this appointment within a day or two after you arrive home to insure a convenient appointment time. 10. OTHER INSTRUCTIONS: ______________________________Liquid or very soft diet for first week. Miralax as needed for constipation____________________________________________________________________________________________ __________________________________________________________________________________________________________________________ WHEN TO CALL YOUR DOCTOR: 1. Fever over 101.0 2. Inability to urinate 3. Continued bleeding from incision. 4. Increased pain, redness, or drainage from the incision. 5. Increasing abdominal pain  The clinic staff is available to answer your questions during regular business hours.  Please dont hesitate to call and ask to speak to one of the nurses for clinical concerns.  If you have a medical emergency, go to the nearest emergency room or call 911.  A surgeon from [facility] is always on call at the hospital. [address] [place], [place], [place]  [address] ? [id number],  [place], [place]   [address] [phone] ? [phone] ? FAX [phone] Web  site: [email or web address]

## 2017-06-10 ENCOUNTER — Telehealth: Payer: Self-pay | Admitting: Nurse Practitioner

## 2017-06-10 NOTE — Telephone Encounter (Signed)
Spoke to patient regarding appointment D/T/Loc/Ph# per referral message from [facility]

## 2017-06-17 ENCOUNTER — Inpatient Hospital Stay: Payer: Federal, State, Local not specified - PPO | Attending: Nurse Practitioner | Admitting: Nurse Practitioner

## 2017-06-17 ENCOUNTER — Telehealth: Payer: Self-pay | Admitting: Oncology

## 2017-06-17 ENCOUNTER — Encounter: Payer: Self-pay | Admitting: Nurse Practitioner

## 2017-06-17 VITALS — BP 125/56 | HR 64 | Temp 98.3°F | Resp 18 | Ht 66.0 in | Wt 124.4 lb

## 2017-06-17 DIAGNOSIS — C49A2 Gastrointestinal stromal tumor of stomach: Secondary | ICD-10-CM | POA: Diagnosis not present

## 2017-06-17 DIAGNOSIS — E039 Hypothyroidism, unspecified: Secondary | ICD-10-CM | POA: Diagnosis not present

## 2017-06-17 DIAGNOSIS — Z808 Family history of malignant neoplasm of other organs or systems: Secondary | ICD-10-CM | POA: Insufficient documentation

## 2017-06-17 DIAGNOSIS — C49A0 Gastrointestinal stromal tumor, unspecified site: Secondary | ICD-10-CM | POA: Insufficient documentation

## 2017-06-17 DIAGNOSIS — Z79899 Other long term (current) drug therapy: Secondary | ICD-10-CM | POA: Insufficient documentation

## 2017-06-17 DIAGNOSIS — Z801 Family history of malignant neoplasm of trachea, bronchus and lung: Secondary | ICD-10-CM

## 2017-06-17 DIAGNOSIS — J449 Chronic obstructive pulmonary disease, unspecified: Secondary | ICD-10-CM | POA: Insufficient documentation

## 2017-06-17 NOTE — Progress Notes (Signed)
  Oncology Nurse Navigator Documentation  Navigator Location: [facility] (06/17/17 1449) Referral date to RadOnc/MedOnc: 06/08/17 (06/17/17 1449) )Navigator Encounter Type: Initial MedOnc (06/17/17 1449)   Abnormal Finding Date: 03/26/17 (06/17/17 1449) Confirmed Diagnosis Date: 04/08/17 (06/17/17 1449) Surgery Date: 06/01/17 (06/17/17 1449)           Treatment Initiated Date: 06/01/17 (06/17/17 1449)     Barriers/Navigation Needs: No barriers at this time;No Questions;No Needs (06/17/17 1449)   Interventions: Psycho-social support (06/17/17 1449)    Met with patient and husband during initial med.onc appointment. I introduced my role as GI navigator. No barriers identified. Patient verbalized understanding that she can call me with questions or concerns.        Acuity: Level 1 (06/17/17 1449)         Time Spent with Patient: 15 (06/17/17 1449)

## 2017-06-17 NOTE — Progress Notes (Addendum)
New Hematology/Oncology Consult   Referral MD: Dr. Excell Seltzer  [phone]      Reason for Referral: Gastrointestinal stromal tumor  HPI: Carrie Allison is a 57 year old woman who initially presented with lower abdominal pressure and discomfort in September/October 2018.  She underwent evaluation including a CT abdomen/pelvis on 03/26/2017 that showed a solid appearing mass in the left upper quadrant of the abdomen associated with the posterior wall of the stomach.  The initial symptoms she was experiencing resolved.  Upper endoscopic ultrasound by Dr. Ardis Hughs on 04/08/2017 showed a 4.2 cm x 3 cm submucosal gastric mass along the posterior wall of the stomach of the proximal stomach consistent with a gastric GIST.  FNA showed a spindle cell neoplasm consistent with gastrointestinal stromal tumor.  On 06/01/2017 she was taken to the OR by Dr. Excell Seltzer and underwent laparoscopic resection.  Pathology showed gastrointestinal stromal tumor measuring 4.3 cm; high-grade; mitotic rate greater than 5/50 High-Power field (10/50); margins negative; tumor strongly positive for CD117 (C-kit) and CD 34 and is negative smooth muscle actin, desmin and S100.    Past Medical History:  Diagnosis Date  . Abnormal Pap smear    H/O  . History of gastrointestinal stromal tumor (GIST)   . Hypothyroidism   . Thyroid disease    hypothyroid  :  Past Surgical History:  Procedure Laterality Date  . CERVICAL BIOPSY  W/ LOOP ELECTRODE EXCISION  2003  . cold knife conization  3/12  . EUS N/A 04/08/2017   Procedure: UPPER ENDOSCOPIC ULTRASOUND (EUS) LINEAR;  Surgeon: Milus Banister, MD;  Location: [facility];  Service: Endoscopy;  Laterality: N/A;  . LAPAROSCOPIC GASTRIC RESECTION N/A 06/01/2017   Procedure: LAPAROSCOPIC RESECTION GASTRIC OF GIST;  Surgeon: Excell Seltzer, MD;  Location: [facility]S;  Service: General;  Laterality: N/A;  :   Current Outpatient Medications:  .  CALCIUM PO, Take 1,500 mg by mouth  daily. , Disp: , Rfl:  .  Cholecalciferol (VITAMIN D PO), Take 2,000 Units by mouth daily. , Disp: , Rfl:  .  Multiple Vitamin (MULTIVITAMIN WITH MINERALS) TABS tablet, Take 1 tablet daily by mouth., Disp: , Rfl:  .  oxyCODONE (OXY IR/ROXICODONE) 5 MG immediate release tablet, Take 1 tablet (5 mg total) by mouth every 6 (six) hours as needed for severe pain., Disp: 6 tablet, Rfl: 0 .  Red Yeast Rice Extract (RED YEAST RICE PO), Take 2 tablets daily by mouth., Disp: , Rfl:  .  SYNTHROID 75 MCG tablet, Take 75 mcg daily before breakfast by mouth. , Disp: , Rfl:  .  traMADol (ULTRAM) 50 MG tablet, Take 1 tablet (50 mg total) by mouth every 6 (six) hours as needed (mild pain)., Disp: 15 tablet, Rfl: 0 .  zaleplon (SONATA) 10 MG capsule, Take 10 mg at bedtime as needed by mouth for sleep., Disp: , Rfl: :  :  Allergies  Allergen Reactions  . Fish Allergy     Stomach upset, diarrhea, ; SALMON  AND TUNA ; MOSTLY OILY FISHES   :  FH: Mother is 92 years old, COPD; father deceased in his late 38s with lung cancer; 2 sisters reported to be in good health.  Maternal grandmother with history of cervical cancer.  SOCIAL HISTORY: She lives in [place].  She is married.  She has a daughter age 54 in good health.  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  Intake.  She has never had a blood transfusion.  Review of Systems: Initial  low abdominal pressure/discomfort September/October 2018.  This has resolved.  No anorexia or weight loss.  No fevers or sweats.  No unusual headaches or vision change.  No bleeding.  No dysphagia.  No nausea or vomiting.  No shortness of breath.  No chest pain.  No change in bowel habits.  No hematuria or dysuria.  No numbness or tingling in her hands or feet.  No focal weakness.   Physical Exam:  Blood pressure (!) 125/56, pulse 64, temperature 98.3 F (36.8 C), temperature source Oral, resp. rate 18, height _0  (1.676 m), weight 124 lb 6.4 oz (56.4 kg), SpO2 100  %.  HEENT: Pupils equal round and reactive to light.  Extraocular movements intact.  Sclera anicteric. Lungs: Lungs clear bilaterally. Cardiac: Regular rate and rhythm. Abdomen: Abdomen is soft.  Healing surgical incisions.  No hepatomegaly.  Vascular: No leg edema.  Calves soft and nontender. Lymph nodes: No palpable cervical, supraclavicular, right axillary or inguinal lymph nodes.  Question a few tiny left axillary lymph nodes. Neurologic: Alert and oriented.  Motor strength 5/5.  LABS: None.  Assessment and Plan:   1. Gastrointestinal stromal tumor of the stomach  CT abdomen/pelvis on 03/26/2017-solid appearing mass in the left upper quadrant of the abdomen associated with the posterior wall of the stomach.   Upper endoscopic ultrasound by Dr. Ardis Hughs on 04/08/2017-4.2 cm x 3 cm submucosal gastric mass along the posterior wall of the stomach of the proximal stomach consistent with a gastric GIST.    FNA showed a spindle cell neoplasm consistent with gastrointestinal stromal tumor.    Status post laparoscopic resection 06/01/2017.    Pathology-gastrointestinal stromal tumor measuring 4.3 cm; high-grade; mitotic rate greater than 5/50 High-Power field (10/50); margins negative; tumor strongly positive for CD117 (C-kit) and CD 34 and is negative smooth muscle actin, desmin and S100. 2. Hypothyroid  Carrie Allison is a 58 year old woman with a gastrointestinal stromal tumor of the stomach.  She is status post resection.  Dr. Benay Spice reviewed the diagnosis, prognosis and treatment options with Carrie Allison and her husband.  Dr. Benay Spice recommends adjuvant therapy with [place].  We reviewed potential toxicities associated with Gleevec including bone marrow toxicity, diarrhea, nausea, rash, fluid retention.  She will attend a chemotherapy education class.  We will obtain baseline labs the day of the education class.  Her case will be presented at the upcoming GI tumor conference.  We anticipate she  will begin [place] in approximately 2 weeks.  She will return for a follow-up visit/labs in 4 weeks.  She will contact the office in the interim with any problems.  Patient seen with Dr. Benay Spice.  50 minutes were spent face-to-face at today's visit with the majority of that time involved in counseling/coordination of care.  Ned Card, NP 06/17/2017, 1:38 PM   This was a shared visit with Ned Card.  Carrie Allison was interviewed and examined.  We reviewed the details of the surgical pathology report with her.  She appears to have a significant risk of developing disease recurrence.  This is based on the tumor size and mitotic rate.  We discussed data supporting the use of imatinib in the adjuvant setting for patients with high risk of developing recurrent disease.  I recommend adjuvant imatinib.  We reviewed the potential toxicities associated with imatinib and she agrees to proceed.  She will attended chemotherapy teaching class.  I will present her case at the GI tumor conference.  We offered her a second opinion.  She agrees to proceed with adjuvant imatinib.  The plan is to begin treatment in approximately 2 weeks.  Julieanne Manson, MD

## 2017-06-17 NOTE — Telephone Encounter (Signed)
Gave avs and calendar for february °

## 2017-06-18 ENCOUNTER — Telehealth: Payer: Self-pay | Admitting: Pharmacy Technician

## 2017-06-18 NOTE — Telephone Encounter (Signed)
Oral Oncology Patient Advocate Encounter  Received notification from [place] that prior authorization for [place] is required.  PA submitted on CoverMyMeds Key [id number] Status is pending  Oral Oncology Clinic will continue to follow.  Fabio Asa. Melynda Keller, [place] Patient [place] [phone] 06/18/2017 11:23 AM

## 2017-06-22 ENCOUNTER — Telehealth: Payer: Self-pay | Admitting: Pharmacist

## 2017-06-22 ENCOUNTER — Telehealth: Payer: Self-pay | Admitting: Oncology

## 2017-06-22 DIAGNOSIS — C49A2 Gastrointestinal stromal tumor of stomach: Secondary | ICD-10-CM

## 2017-06-22 MED ORDER — IMATINIB MESYLATE 400 MG PO TABS: 400.0000 mg | ORAL_TABLET | Freq: Every day | ORAL | 0 refills | Status: DC

## 2017-06-22 NOTE — Telephone Encounter (Signed)
Returned call to patient with info regarding upcoming Chemo Edu class as she requested.

## 2017-06-22 NOTE — Telephone Encounter (Signed)
Oral Oncology Pharmacist Encounter  Received new prescription for Gleevec (imatinib) for the treatment of newly diagnosed GIST with high mitotic rate, s/p resection with negative margins, planned duration until disease progression or unacceptable toxicity.  Labs from Epic assessed, no complete metabolic panel (will be performed on 06/24/17 with repeat CBC), current meds WNL and OK for treatment initiation. 05/24/17 SCr=0.86, est CrCl ~60 mL/min, no dose adjustments needed for CrCl > 40 mL/min, however max dose is need to be 600mg  daily with CrCl 40-59 mL/min. Renal function will be monitored. No cardiac history noted, patient will be monitored for signs of heart failure or decreased LVEF  Current medication list in Epic reviewed, no significant DDIs with Gleevec identified. There is a risk of hypothyroid in patients on pre-existing thyroid replacement therapy and who were s/p thyroidectomy. This hypothyroidism has not been seen in patients on thyroid replacement with thyroid in situ. No history of thyroidectomy noted for patient. TSH will be checked at lab appt on 06/24/17 and repeated every 12 weeks x 2 to ensure patient remains euthyroid.  Prescription has been e-scribed to [facility] program at [place] for benefits analysis and approval per insurance requirement.  Oral Oncology Clinic will continue to follow for insurance authorization, copayment issues, initial counseling and start date.  Johny Drilling, PharmD, BCPS, BCOP 06/22/2017 11:23 AM Oral Oncology Clinic [phone]

## 2017-06-22 NOTE — Telephone Encounter (Signed)
Oral Chemotherapy Pharmacist Encounter   I spoke with patient for overview of: Gleevec.   Pt is doing well. Counseled patient on administration, dosing, side effects, monitoring, drug-food interactions, safe handling, storage, and disposal.  Patient will take Gleevec 400mg  tablets, 1 tablet (400mg ) by mouth once daily with a meal and a large glass of water.  Patient knows food may decrease stomach irritation and to maintain hydration while on treatment with Gleevec.  Patient states she eats several small meals a day and that she does not have much of an appetite. We discussed taking her Gleevec after her 2nd meal of the day.  Patient counseled to avoid grapefruit and grapefruit juice.  Gleevec start date: TBD, ~06/28/17  Side effects include but not limited to: nausea, vomiting, diarrhea, fatigue, muscle cramps, lower extremity edema, rash, decreased blood counts, and cardiac dysfunction.  Patient requests prescription for anti-emetic be sent to [facility] on [address] and [address] in [place].    Reviewed with patient importance of keeping a medication schedule and plan for any missed doses.  Carrie Allison voiced understanding and appreciation.   All questions answered. Medication reconciliation performed and medication/allergy list updated.  Discussed disease state interaction with hypothyroidism and Synthroid. TSH will be monitored.  Patient updated about [facility] as the filling pharmacy for [facility] and provided phone number to [facility] program (ph:[phone]), patient is familiar with specialty pharmacy process as her husband is on Humira.   Patient knows to call the office with questions or concerns. Oral Oncology Clinic will continue to follow.  Thank you,  Johny Drilling, PharmD, BCPS, BCOP 06/22/2017   3:01 PM Oral Oncology Clinic [phone]

## 2017-06-24 ENCOUNTER — Other Ambulatory Visit: Payer: Self-pay

## 2017-06-24 ENCOUNTER — Inpatient Hospital Stay: Payer: Federal, State, Local not specified - PPO

## 2017-06-24 ENCOUNTER — Encounter: Payer: Self-pay | Admitting: *Deleted

## 2017-06-24 ENCOUNTER — Telehealth: Payer: Self-pay | Admitting: Pharmacist

## 2017-06-24 DIAGNOSIS — J449 Chronic obstructive pulmonary disease, unspecified: Secondary | ICD-10-CM | POA: Diagnosis not present

## 2017-06-24 DIAGNOSIS — E039 Hypothyroidism, unspecified: Secondary | ICD-10-CM | POA: Diagnosis not present

## 2017-06-24 DIAGNOSIS — K3189 Other diseases of stomach and duodenum: Secondary | ICD-10-CM

## 2017-06-24 DIAGNOSIS — C49A0 Gastrointestinal stromal tumor, unspecified site: Secondary | ICD-10-CM | POA: Diagnosis not present

## 2017-06-24 DIAGNOSIS — C49A2 Gastrointestinal stromal tumor of stomach: Secondary | ICD-10-CM

## 2017-06-24 DIAGNOSIS — Z801 Family history of malignant neoplasm of trachea, bronchus and lung: Secondary | ICD-10-CM | POA: Diagnosis not present

## 2017-06-24 DIAGNOSIS — Z808 Family history of malignant neoplasm of other organs or systems: Secondary | ICD-10-CM | POA: Diagnosis not present

## 2017-06-24 DIAGNOSIS — Z79899 Other long term (current) drug therapy: Secondary | ICD-10-CM | POA: Diagnosis not present

## 2017-06-24 LAB — CBC WITH DIFFERENTIAL (CANCER CENTER ONLY)
BASOS ABS: 0 10*3/uL (ref 0.0–0.1)
Basophils Relative: 1 %
EOS PCT: 4 %
Eosinophils Absolute: 0.2 10*3/uL (ref 0.0–0.5)
HCT: 38.8 % (ref 34.8–46.6)
HEMOGLOBIN: 12.8 g/dL (ref 11.6–15.9)
LYMPHS ABS: 1.8 10*3/uL (ref 0.9–3.3)
Lymphocytes Relative: 31 %
MCH: 31.2 pg (ref 25.1–34.0)
MCHC: 33 g/dL (ref 31.5–36.0)
MCV: 94.6 fL (ref 79.5–101.0)
Monocytes Absolute: 0.4 10*3/uL (ref 0.1–0.9)
Monocytes Relative: 7 %
NEUTROS PCT: 57 %
Neutro Abs: 3.4 10*3/uL (ref 1.5–6.5)
PLATELETS: 301 10*3/uL (ref 145–400)
RBC: 4.1 MIL/uL (ref 3.70–5.45)
RDW: 12.4 % (ref 11.2–14.5)
WBC: 5.9 10*3/uL (ref 3.9–10.3)

## 2017-06-24 LAB — CMP (CANCER CENTER ONLY)
ALBUMIN: 4.4 g/dL (ref 3.5–5.0)
ALK PHOS: 64 U/L (ref 40–150)
ALT: 30 U/L (ref 0–55)
ANION GAP: 8 (ref 3–11)
AST: 35 U/L — ABNORMAL HIGH (ref 5–34)
BUN: 14 mg/dL (ref 7–26)
CALCIUM: 10.2 mg/dL (ref 8.4–10.4)
CHLORIDE: 102 mmol/L (ref 98–109)
CO2: 28 mmol/L (ref 22–29)
CREATININE: 0.82 mg/dL (ref 0.60–1.10)
GFR, Est AFR Am: >60 mL/min (ref >60)
GFR, Estimated: >60 mL/min (ref >60)
Glucose, Bld: 87 mg/dL (ref 70–140)
Potassium: 4.2 mmol/L (ref 3.5–5.1)
SODIUM: 138 mmol/L (ref 136–145)
Total Bilirubin: 0.5 mg/dL (ref 0.2–1.2)
Total Protein: 7.6 g/dL (ref 6.4–8.3)

## 2017-06-24 LAB — TSH: TSH: 1.003 u[IU]/mL (ref 0.308–3.960)

## 2017-06-24 MED ORDER — PROCHLORPERAZINE MALEATE 10 MG PO TABS
10.0000 mg | ORAL_TABLET | Freq: Four times a day (QID) | ORAL | 0 refills | Status: DC | PRN
Start: 2017-06-24 — End: 2019-02-15

## 2017-06-24 MED ORDER — GLEEVEC 400 MG PO TABS: 400.0000 mg | ORAL_TABLET | Freq: Every day | ORAL | 0 refills | Status: DC

## 2017-06-24 NOTE — Telephone Encounter (Signed)
Oral Oncology Pharmacist Encounter  Received call from patient with question about whether or not to receive brand name Gleevec instead of generic imatinib. Prescription for Gleevec 400mg  tablets DAW e-scribed to [facility]. New PA will be required and submitted. Patient will obtain manufacturer copayment card to reduce Gleevec copayment from $95/month.  We will keep patient posted if we run into problems with brand name prior authorization.  New Gleevec start date: 07/02/17  Patient requests prescription for anti-emetic be sent to [facility] on [address] and [address] in [place].    Oral Oncology Clinic will continue to follow.  Johny Drilling, PharmD, BCPS, BCOP 06/24/2017 1:49 PM Oral Oncology Clinic [phone]

## 2017-06-24 NOTE — Progress Notes (Signed)
Informed pt that anti-emetic has been sent in to pharmacy. Voiced understanding.

## 2017-06-25 ENCOUNTER — Telehealth: Payer: Self-pay | Admitting: *Deleted

## 2017-06-25 NOTE — Telephone Encounter (Signed)
"  I'd like to speak with education nurse.  I left yesterday before tour.  I will be in a pill.  I would like to know if the information really applies to me.  Everyone in class will receive IV chemotherapy.  Do I avoid buffets, wash fruits, vegetables have someone peel for me."  Education class in session, previously instructed to avoid grapefruit with Oral Pharmacist counseling.  Declined offer to meet nutritionist.  "I'm planning for the rest of my life and expected to take [place] for the next five years.  Nurse mentioned sex, strict diet, side effects symptoms and more.  I will ask more questions with next appointment on 07-19-2017."

## 2017-06-28 ENCOUNTER — Telehealth: Payer: Self-pay | Admitting: *Deleted

## 2017-06-28 NOTE — Telephone Encounter (Signed)
Oral Oncology Patient Advocate Encounter  Prior Authorization for [place] has been approved.    Effective dates: 05/19/2017 through 06/18/2018  Oral Oncology Clinic will continue to follow.   Carrie Allison. Melynda Keller, [place] Patient [place] [phone] 06/28/2017 8:29 AM

## 2017-06-28 NOTE — Progress Notes (Signed)
  Oncology Nurse Navigator Documentation  Navigator Location: [facility] (06/28/17 1114)   )Navigator Encounter Type: Telephone (06/28/17 1114) Telephone: Incoming Call (06/28/17 1114)                     Treatment Phase: Pre-Tx/Tx Discussion (06/28/17 1114) Barriers/Navigation Needs: Education (06/28/17 1114) Education: Newly Diagnosed Cancer Education (06/28/17 1114) Interventions: Other(Left VM with Chemo educator to call patient back.) (06/28/17 1114)  Patient called with questions that relate to her chemo education class. I left a VM with Chemo educator to call patient back.          Acuity: Level 1 (06/28/17 1114)         Time Spent with Patient: 15 (06/28/17 1114)

## 2017-07-01 NOTE — Telephone Encounter (Signed)
Oral Oncology Pharmacist Encounter  Received message from patient that she had started [place] today 07/01/17. Message will be routed to MD.  Johny Drilling, PharmD, BCPS, BCOP 07/01/2017 2:27 PM Oral Oncology Clinic [phone]

## 2017-07-09 ENCOUNTER — Telehealth: Payer: Self-pay

## 2017-07-09 NOTE — Telephone Encounter (Signed)
Pt called to report contsipation. "I've tried prune juice and wanted to know if it's ok to take Miralax. Pt instructed that per MD it is ok to take Miralax. If pt experiences no relief, pt knows to call office back with concerns/questions.

## 2017-07-12 ENCOUNTER — Telehealth: Payer: Self-pay | Admitting: Medical Oncology

## 2017-07-12 NOTE — Telephone Encounter (Signed)
Rash since Sunday -all over body except face, hands and feet. Started Gleevec 10 days ago, she stopped it yesterday . She took one benadryl . Per Dr Benay Spice appt with V. Tanner tomorrow. Pt aware.

## 2017-07-13 ENCOUNTER — Inpatient Hospital Stay: Payer: Federal, State, Local not specified - PPO | Attending: Nurse Practitioner | Admitting: Medical

## 2017-07-13 VITALS — BP 128/70 | HR 75 | Temp 97.9°F | Resp 18 | Ht 66.0 in | Wt 125.0 lb

## 2017-07-13 DIAGNOSIS — C49A0 Gastrointestinal stromal tumor, unspecified site: Secondary | ICD-10-CM | POA: Diagnosis not present

## 2017-07-13 DIAGNOSIS — Z803 Family history of malignant neoplasm of breast: Secondary | ICD-10-CM | POA: Insufficient documentation

## 2017-07-13 DIAGNOSIS — E039 Hypothyroidism, unspecified: Secondary | ICD-10-CM | POA: Diagnosis not present

## 2017-07-13 DIAGNOSIS — Z801 Family history of malignant neoplasm of trachea, bronchus and lung: Secondary | ICD-10-CM | POA: Insufficient documentation

## 2017-07-13 DIAGNOSIS — R5383 Other fatigue: Secondary | ICD-10-CM

## 2017-07-13 DIAGNOSIS — L708 Other acne: Secondary | ICD-10-CM | POA: Diagnosis not present

## 2017-07-13 DIAGNOSIS — R630 Anorexia: Secondary | ICD-10-CM

## 2017-07-13 MED ORDER — METHYLPREDNISOLONE 4 MG PO TBPK: ORAL_TABLET | ORAL | 0 refills | Status: DC

## 2017-07-13 NOTE — Patient Instructions (Signed)
Allegra once daily  Zyrtec 10 mg once daily

## 2017-07-13 NOTE — Progress Notes (Signed)
Symptoms Management Clinic Progress Note   Carrie Allison [id number] May 05, 1961 57 y.o.  Carrie Allison is managed by Dr. Dominica Severin B. Sherrill  Actively treated with chemotherapy: yes  Current Therapy: Gleevec  Last Treated: 02 / 16 / 2019  Assessment: Plan:    Acneiform rash - Plan: methylPREDNISolone (MEDROL DOSEPAK) 4 MG TBPK tablet   Acneiform rash due to [place]: Carrie Allison will remain off of [place] until her return appointment with Dr. Benay Spice on 07/19/2017. Dr. Benay Spice mentioned the options of either restarting Gleevec at a lower dose of 100 mg daily with plans to titrate the dose versus stopping Gleevec. Carrie Allison was given a 6 day medrol dose pack which she will begin today. Additionally, she was instructed to continue taking Allegra once daily and to add Zyrtec 10 mg once daily.   Please see After Visit Summary for patient specific instructions.  Future Appointments  Date Time Provider [place]  07/19/2017  8:00 AM [facility] None  07/19/2017  8:30 AM Ladell Pier, MD [facility] None    No orders of the defined types were placed in this encounter.      Subjective:   Patient ID:  Carrie Allison is a 57 y.o. (DOB Nov 09, 1960) female.  Chief Complaint:  Chief Complaint  Patient presents with  . Rash    HPI Carrie Allison is a 57 year old female with a diagnosis of a gastrointestinal stromal tumor (GIST) that was diagnosed on 06/01/2017 when she underwent a laparoscopic resection with pathology showing a high-grade gastrointestinal stromal tumor. She was last seen in our office on 06/17/2017. She was started on Gleevec 400 mg once daily. She completed 10 days of therapy with her last dose taken on 07/10/2017. She reports that her skin felt hot and was burning on Saturday evening. She awoke to find a wide spread pruritic, acneiform rash over her arms, legs, abdomen, anterior and posterior trunk on Sunday morning. She also noted mild  anorexia, changes in the taste of food, mild depression, constipation, belching, and bloating. Her rash is slightly better and her associated symptoms have improved since stopping Gleevec after her Saturday dose. She is taking Allegra which lessens the pruritis. She denies chest tightness, shortness of breath, or difficulty swallowing.   Medications: I have reviewed the patient's current medications.  Allergies:  Allergies  Allergen Reactions  . Fish Allergy     Stomach upset, diarrhea, ; SALMON  AND TUNA ; MOSTLY OILY FISHES     Past Medical History:  Diagnosis Date  . Abnormal Pap smear    H/O  . History of gastrointestinal stromal tumor (GIST)   . Hypothyroidism   . Thyroid disease    hypothyroid    Past Surgical History:  Procedure Laterality Date  . CERVICAL BIOPSY  W/ LOOP ELECTRODE EXCISION  2003  . cold knife conization  3/12  . EUS N/A 04/08/2017   Procedure: UPPER ENDOSCOPIC ULTRASOUND (EUS) LINEAR;  Surgeon: Milus Banister, MD;  Location: [facility];  Service: Endoscopy;  Laterality: N/A;  . LAPAROSCOPIC GASTRIC RESECTION N/A 06/01/2017   Procedure: LAPAROSCOPIC RESECTION GASTRIC OF GIST;  Surgeon: Excell Seltzer, MD;  Location: [facility];  Service: General;  Laterality: N/A;    Family History  Problem Relation Age of Onset  . Hypertension Mother   . Cancer Maternal Grandmother        breast  . Cancer Father        lung/melanoma  . Colon cancer  Neg Hx   . Esophageal cancer Neg Hx   . Rectal cancer Neg Hx   . Stomach cancer Neg Hx     Social History   Socioeconomic History  . Marital status: Married    Spouse name: Not on file  . Number of children: Not on file  . Years of education: Not on file  . Highest education level: Not on file  Social Needs  . Financial resource strain: Not on file  . Food insecurity - worry: Not on file  . Food insecurity - inability: Not on file  . Transportation needs - medical: Not on file  . Transportation needs -  non-medical: Not on file  Occupational History  . Not on file  Tobacco Use  . Smoking status: Never Smoker  . Smokeless tobacco: Never Used  Substance and Sexual Activity  . Alcohol use: No  . Drug use: No  . Sexual activity: Yes    Partners: Male  Other Topics Concern  . Not on file  Social History Narrative  . Not on file    Past Medical History, Surgical history, Social history, and Family history were reviewed and updated as appropriate.   Please see review of systems for further details on the patient's review from today.   Review of Systems:  Review of Systems  Constitutional: Positive for appetite change and fatigue. Negative for chills, diaphoresis and fever.  HENT: Negative for trouble swallowing.   Respiratory: Negative for cough, chest tightness, shortness of breath and wheezing.   Cardiovascular: Negative for chest pain and palpitations.  Gastrointestinal: Positive for constipation. Negative for diarrhea, nausea and vomiting.  Skin: Positive for rash.  Psychiatric/Behavioral: Positive for dysphoric mood.    Objective:   Physical Exam:  BP 128/70 (BP Location: Left Arm, Patient Position: Sitting)   Pulse 75   Temp 97.9 F (36.6 C) (Oral)   Resp 18   Ht 5\' 6"  (1.676 m)   Wt 125 lb (56.7 kg)   SpO2 100%   BMI 20.18 kg/m  ECOG: 0  Physical Exam  Constitutional: No distress.  HENT:  Head: Normocephalic and atraumatic.  Cardiovascular: Normal rate, regular rhythm and normal heart sounds. Exam reveals no gallop and no friction rub.  No murmur heard. Pulmonary/Chest: Effort normal and breath sounds normal. No respiratory distress. She has no wheezes. She has no rales.  Musculoskeletal: She exhibits no edema.  Neurological: She is alert. Coordination normal.  Skin: Rash noted. She is not diaphoretic.     Psychiatric: She has a normal mood and affect. Her behavior is normal. Judgment and thought content normal.    Lab Review:     Component Value  Date/Time   NA 138 06/24/2017 0921   K 4.2 06/24/2017 0921   CL 102 06/24/2017 0921   CO2 28 06/24/2017 0921   GLUCOSE 87 06/24/2017 0921   BUN 14 06/24/2017 0921   CREATININE 0.82 06/24/2017 0921   CALCIUM 10.2 06/24/2017 0921   PROT 7.6 06/24/2017 0921   ALBUMIN 4.4 06/24/2017 0921   AST 35 (H) 06/24/2017 0921   ALT 30 06/24/2017 0921   ALKPHOS 64 06/24/2017 0921   BILITOT 0.5 06/24/2017 0921   GFRNONAA >60 06/24/2017 0921   GFRAA >60 06/24/2017 0921       Component Value Date/Time   WBC 5.9 06/24/2017 0921   WBC 10.8 (H) 06/02/2017 0504   RBC 4.10 06/24/2017 0921   HGB 11.1 (L) 06/02/2017 0504   HCT 38.8 06/24/2017 3875  PLT 301 06/24/2017 0921   MCV 94.6 06/24/2017 0921   MCH 31.2 06/24/2017 0921   MCHC 33.0 06/24/2017 0921   RDW 12.4 06/24/2017 0921   LYMPHSABS 1.8 06/24/2017 0921   MONOABS 0.4 06/24/2017 0921   EOSABS 0.2 06/24/2017 0921   BASOSABS 0.0 06/24/2017 0921   -------------------------------  Imaging from last 24 hours (if applicable):  Radiology interpretation: No results found.      This patient was seen with Dr. Benay Spice with my treatment plan reviewed with him. He expressed agreement with my medical management of this patient. This was a shared visit with Sandi Mealy.  Ms. Schwimmer was interviewed and examined.  She has developed a skin rash secondary to [place].  She appears to have atypical maculopapular tyrosine kinase inhibitor rash.  Gleevec was discontinued.  She will complete a steroid Dosepak.  She will return for an office visit and reassessment on 07/19/2017.  We will consider reintroducing Gleevec at a 100 mg daily dose.  Julieanne Manson, MD

## 2017-07-15 ENCOUNTER — Telehealth: Payer: Self-pay | Admitting: *Deleted

## 2017-07-15 NOTE — Telephone Encounter (Signed)
Message from pt asking if she needs labs drawn on 2/25. She will be off Gleevec 7 days by then. Reviewed with Dr. Benay Spice: Keep lab appt. Pt made aware.

## 2017-07-19 ENCOUNTER — Encounter: Payer: Self-pay | Admitting: Oncology

## 2017-07-19 ENCOUNTER — Other Ambulatory Visit: Payer: Self-pay | Admitting: Oncology

## 2017-07-19 ENCOUNTER — Other Ambulatory Visit: Payer: Self-pay

## 2017-07-19 ENCOUNTER — Inpatient Hospital Stay: Payer: Federal, State, Local not specified - PPO

## 2017-07-19 ENCOUNTER — Inpatient Hospital Stay (HOSPITAL_BASED_OUTPATIENT_CLINIC_OR_DEPARTMENT_OTHER): Payer: Federal, State, Local not specified - PPO | Admitting: Oncology

## 2017-07-19 VITALS — BP 144/67 | HR 66 | Temp 97.7°F | Resp 17 | Ht 66.0 in | Wt 124.4 lb

## 2017-07-19 DIAGNOSIS — Z803 Family history of malignant neoplasm of breast: Secondary | ICD-10-CM | POA: Diagnosis not present

## 2017-07-19 DIAGNOSIS — E039 Hypothyroidism, unspecified: Secondary | ICD-10-CM | POA: Diagnosis not present

## 2017-07-19 DIAGNOSIS — C49A2 Gastrointestinal stromal tumor of stomach: Secondary | ICD-10-CM

## 2017-07-19 DIAGNOSIS — Z801 Family history of malignant neoplasm of trachea, bronchus and lung: Secondary | ICD-10-CM | POA: Diagnosis not present

## 2017-07-19 DIAGNOSIS — R5383 Other fatigue: Secondary | ICD-10-CM

## 2017-07-19 DIAGNOSIS — C49A0 Gastrointestinal stromal tumor, unspecified site: Secondary | ICD-10-CM | POA: Diagnosis not present

## 2017-07-19 DIAGNOSIS — L708 Other acne: Secondary | ICD-10-CM

## 2017-07-19 DIAGNOSIS — R630 Anorexia: Secondary | ICD-10-CM | POA: Diagnosis not present

## 2017-07-19 LAB — CBC WITH DIFFERENTIAL (CANCER CENTER ONLY)
BASOS ABS: 0.1 10*3/uL (ref 0.0–0.1)
Basophils Relative: 2 %
EOS ABS: 0.5 10*3/uL (ref 0.0–0.5)
EOS PCT: 8 %
HCT: 37.5 % (ref 34.8–46.6)
Hemoglobin: 12.6 g/dL (ref 11.6–15.9)
LYMPHS ABS: 2.4 10*3/uL (ref 0.9–3.3)
Lymphocytes Relative: 39 %
MCH: 31.5 pg (ref 25.1–34.0)
MCHC: 33.6 g/dL (ref 31.5–36.0)
MCV: 93.8 fL (ref 79.5–101.0)
Monocytes Absolute: 0.6 10*3/uL (ref 0.1–0.9)
Monocytes Relative: 9 %
Neutro Abs: 2.6 10*3/uL (ref 1.5–6.5)
Neutrophils Relative %: 42 %
PLATELETS: 402 10*3/uL — AB (ref 145–400)
RBC: 3.99 MIL/uL (ref 3.70–5.45)
RDW: 13.1 % (ref 11.2–14.5)
WBC: 6.2 10*3/uL (ref 3.9–10.3)

## 2017-07-19 LAB — CMP (CANCER CENTER ONLY)
ALT: 29 U/L (ref 0–55)
AST: 21 U/L (ref 5–34)
Albumin: 4.3 g/dL (ref 3.5–5.0)
Alkaline Phosphatase: 75 U/L (ref 40–150)
Anion gap: 9 (ref 3–11)
BUN: 14 mg/dL (ref 7–26)
CO2: 28 mmol/L (ref 22–29)
CREATININE: 0.8 mg/dL (ref 0.60–1.10)
Calcium: 9.8 mg/dL (ref 8.4–10.4)
Chloride: 100 mmol/L (ref 98–109)
GFR, Est AFR Am: >60 mL/min (ref >60)
GLUCOSE: 81 mg/dL (ref 70–140)
Potassium: 3.6 mmol/L (ref 3.5–5.1)
Sodium: 137 mmol/L (ref 136–145)
TOTAL PROTEIN: 7.2 g/dL (ref 6.4–8.3)
Total Bilirubin: 0.4 mg/dL (ref 0.2–1.2)

## 2017-07-19 LAB — TSH: TSH: 3.522 u[IU]/mL (ref 0.308–3.960)

## 2017-07-19 MED ORDER — IMATINIB MESYLATE 100 MG PO TABS: 100.0000 mg | ORAL_TABLET | Freq: Every day | ORAL | 0 refills | Status: DC

## 2017-07-19 NOTE — Progress Notes (Signed)
  [place] OFFICE PROGRESS NOTE   Diagnosis: Gastrointestinal stromal tumor of the stomach  INTERVAL HISTORY:   Carrie Allison returns as scheduled.  He reports the rash has improved significantly since we saw her last week.  She completed a steroid Dosepak.  She feels well.  She has a mild remaining rash, chiefly over the legs.  In addition to the rash she noted arthralgias while on Gleevec.  Objective:  Vital signs in last 24 hours:  Blood pressure (!) 144/67, pulse 66, temperature 97.7 F (36.5 C), temperature source Oral, resp. rate 17, height '5\' 6"'$  (1.676 m), weight 124 lb 6.4 oz (56.4 kg), SpO2 100 %.    HEENT: No thrush or ulcers Resp: Lungs clear bilaterally Cardio: Regular rate and rhythm GI: No hepatomegaly, nontender Vascular: No leg edema  Skin: Fading rash over the trunk and extremities   Lab Results:  Lab Results  Component Value Date   WBC 6.2 07/19/2017   HGB 11.1 (L) 06/02/2017   HCT 37.5 07/19/2017   MCV 93.8 07/19/2017   PLT 402 (H) 07/19/2017   NEUTROABS 2.6 07/19/2017    CMP     Component Value Date/Time   NA 138 06/24/2017 0921   K 4.2 06/24/2017 0921   CL 102 06/24/2017 0921   CO2 28 06/24/2017 0921   GLUCOSE 87 06/24/2017 0921   BUN 14 06/24/2017 0921   CREATININE 0.82 06/24/2017 0921   CALCIUM 10.2 06/24/2017 0921   PROT 7.6 06/24/2017 0921   ALBUMIN 4.4 06/24/2017 0921   AST 35 (H) 06/24/2017 0921   ALT 30 06/24/2017 0921   ALKPHOS 64 06/24/2017 0921   BILITOT 0.5 06/24/2017 0921   GFRNONAA >60 06/24/2017 0921   GFRAA >60 06/24/2017 0921    Medications: I have reviewed the patient's current medications.   Assessment/Plan: 1. Gastrointestinal stromal tumor of the stomach  CT abdomen/pelvis on 03/26/2017-solid appearing mass in the left upper quadrant of the abdomen associated with the posterior wall of the stomach.   Upper endoscopic ultrasound by Dr. Ardis Hughs on 04/08/2017-4.2 cm x 3 cm submucosal gastric mass along  the posterior wall of the stomach of the proximal stomach consistent with a gastric GIST.    FNA showed a spindle cell neoplasm consistent with gastrointestinal stromal tumor.    Status post laparoscopic resection 06/01/2017.    Pathology-gastrointestinal stromal tumor measuring 4.3 cm; high-grade; mitotic rate greater than 5/50 High-Power field (10/50); margins negative; tumor strongly positive for CD117 (C-kit) and CD 34 and is negative smooth muscle actin, desmin and S100.  Initiation of Gleevec 07/01/2017, discontinued after 10 days secondary to a severe rash  Completion of Medrol Dosepak beginning 07/13/2017 2. Hypothyroid  Disposition: Carrie Allison appears well.  The rash is significantly improved and almost completely resolved compared to when I saw her last week. We discussed the risk of resuming imatinib.  She understands the potential for a recurrent rash.  We decided to resume imatinib at a dose of 100 mg daily beginning 07/29/2017.  She will return for office and lab visit 08/11/2017.  She will discontinue the imatinib and contact us if she develops a recurrent rash.   Betsy Coder, MD  07/19/2017  9:11 AM

## 2017-07-21 ENCOUNTER — Telehealth: Payer: Self-pay | Admitting: Pharmacy Technician

## 2017-07-21 NOTE — Telephone Encounter (Signed)
Oral Oncology Patient Advocate Encounter  Received notification from [place] FEP Program that due to the patient's recent reduction in dose (which requires that a new tablet strength be dispensed) a new prior authorization for [place] is required.  An urgent request for prior authorization has been submitted via CoverMyMeds Key [id number] Status is pending  Oral Oncology Clinic will continue to follow.  Fabio Asa. Melynda Keller, [place] Patient [place] [phone] 07/21/2017 10:28 AM

## 2017-07-21 NOTE — Telephone Encounter (Signed)
Oral Oncology Patient Advocate Encounter  Prior Authorization for Gleevec (100mg  tablets) has been approved.    Effective dates: 06/21/2017 through 07/21/2018  I have contacted [facility] to let them know of the approval.  They will be reaching out to Mrs Sager today to arrange for delivery of her medication.   Fabio Asa. Melynda Keller, [place] Patient [place] [phone] 07/21/2017 12:18 PM

## 2017-07-22 DIAGNOSIS — Z13820 Encounter for screening for osteoporosis: Secondary | ICD-10-CM | POA: Diagnosis not present

## 2017-08-09 DIAGNOSIS — C49A2 Gastrointestinal stromal tumor of stomach: Secondary | ICD-10-CM | POA: Diagnosis not present

## 2017-08-11 ENCOUNTER — Other Ambulatory Visit: Payer: Self-pay | Admitting: *Deleted

## 2017-08-11 ENCOUNTER — Inpatient Hospital Stay: Payer: Federal, State, Local not specified - PPO | Attending: Nurse Practitioner | Admitting: Oncology

## 2017-08-11 ENCOUNTER — Telehealth: Payer: Self-pay | Admitting: Oncology

## 2017-08-11 ENCOUNTER — Inpatient Hospital Stay: Payer: Federal, State, Local not specified - PPO

## 2017-08-11 VITALS — BP 125/70 | HR 72 | Temp 97.8°F | Resp 18 | Ht 66.0 in | Wt 124.6 lb

## 2017-08-11 DIAGNOSIS — C49A2 Gastrointestinal stromal tumor of stomach: Secondary | ICD-10-CM | POA: Diagnosis not present

## 2017-08-11 DIAGNOSIS — C49A0 Gastrointestinal stromal tumor, unspecified site: Secondary | ICD-10-CM | POA: Insufficient documentation

## 2017-08-11 DIAGNOSIS — E039 Hypothyroidism, unspecified: Secondary | ICD-10-CM | POA: Diagnosis not present

## 2017-08-11 DIAGNOSIS — R21 Rash and other nonspecific skin eruption: Secondary | ICD-10-CM | POA: Diagnosis not present

## 2017-08-11 LAB — CBC WITH DIFFERENTIAL (CANCER CENTER ONLY)
Basophils Absolute: 0.1 10*3/uL (ref 0.0–0.1)
Basophils Relative: 1 %
EOS ABS: 0.3 10*3/uL (ref 0.0–0.5)
Eosinophils Relative: 6 %
HCT: 38.9 % (ref 34.8–46.6)
Hemoglobin: 12.9 g/dL (ref 11.6–15.9)
LYMPHS ABS: 1.2 10*3/uL (ref 0.9–3.3)
LYMPHS PCT: 28 %
MCH: 31.4 pg (ref 25.1–34.0)
MCHC: 33.2 g/dL (ref 31.5–36.0)
MCV: 94.6 fL (ref 79.5–101.0)
MONO ABS: 0.3 10*3/uL (ref 0.1–0.9)
MONOS PCT: 7 %
Neutro Abs: 2.5 10*3/uL (ref 1.5–6.5)
Neutrophils Relative %: 58 %
Platelet Count: 302 10*3/uL (ref 145–400)
RBC: 4.11 MIL/uL (ref 3.70–5.45)
RDW: 13.6 % (ref 11.2–14.5)
WBC: 4.4 10*3/uL (ref 3.9–10.3)

## 2017-08-11 LAB — CMP (CANCER CENTER ONLY)
ALT: 28 U/L (ref 0–55)
ANION GAP: 8 (ref 3–11)
AST: 29 U/L (ref 5–34)
Albumin: 4.5 g/dL (ref 3.5–5.0)
Alkaline Phosphatase: 67 U/L (ref 40–150)
BUN: 13 mg/dL (ref 7–26)
CHLORIDE: 104 mmol/L (ref 98–109)
CO2: 27 mmol/L (ref 22–29)
CREATININE: 0.97 mg/dL (ref 0.60–1.10)
Calcium: 10 mg/dL (ref 8.4–10.4)
Glucose, Bld: 83 mg/dL (ref 70–140)
POTASSIUM: 3.9 mmol/L (ref 3.5–5.1)
SODIUM: 139 mmol/L (ref 136–145)
Total Bilirubin: 0.7 mg/dL (ref 0.2–1.2)
Total Protein: 7.7 g/dL (ref 6.4–8.3)

## 2017-08-11 MED ORDER — IMATINIB MESYLATE 100 MG PO TABS
200.0000 mg | ORAL_TABLET | Freq: Every day | ORAL | 0 refills | Status: DC
Start: 2017-08-11 — End: 2017-08-13

## 2017-08-11 NOTE — Progress Notes (Signed)
  [place] OFFICE PROGRESS NOTE   Diagnosis: Gastrointestinal stromal tumor  INTERVAL HISTORY:   Carrie Allison returns as scheduled.  She resumed Gleevec at a dose of 100 mg daily beginning 07/29/2017.  No edema, nausea, or diarrhea.  She reports minimal remaining rash over the thighs that is fading.  No new rash.  Objective:  Vital signs in last 24 hours:  Blood pressure 125/70, pulse 72, temperature 97.8 F (36.6 C), temperature source Oral, resp. rate 18, height _0  (1.676 m), weight 124 lb 9.6 oz (56.5 kg), SpO2 100 %.    HEENT: No thrush or ulcers Resp: Lungs clear bilaterally Cardio: Regular rate and rhythm with occasional premature beats GI: No hepatosplenomegaly, nontender Vascular: No leg edema Skin: No rash over the back, abdomen, arms, or lower legs    Lab Results:  WBC 4.4, hemoglobin 12.9, platelets 302,000, ANC 2.5  CMP     Component Value Date/Time   NA 137 07/19/2017 0824   K 3.6 07/19/2017 0824   CL 100 07/19/2017 0824   CO2 28 07/19/2017 0824   GLUCOSE 81 07/19/2017 0824   BUN 14 07/19/2017 0824   CREATININE 0.80 07/19/2017 0824   CALCIUM 9.8 07/19/2017 0824   PROT 7.2 07/19/2017 0824   ALBUMIN 4.3 07/19/2017 0824   AST 21 07/19/2017 0824   ALT 29 07/19/2017 0824   ALKPHOS 75 07/19/2017 0824   BILITOT 0.4 07/19/2017 0824   GFRNONAA >60 07/19/2017 0824   GFRAA >60 07/19/2017 0824     Medications: I have reviewed the patient's current medications.   Assessment/Plan: 1. Gastrointestinal stromal tumor of the stomach  CT abdomen/pelvis on 03/26/2017-solid appearing mass in the left upper quadrant of the abdomen associated with the posterior wall of the stomach.   Upper endoscopic ultrasound by Dr. Ardis Hughs on 04/08/2017-4.2 cm x 3 cm submucosal gastric mass along the posterior wall of the stomach of the proximal stomach consistent with a gastricGIST.   FNA showed a spindle cell neoplasm consistent with gastrointestinal stromal  tumor.  Status post laparoscopic resection 06/01/2017.  Pathology-gastrointestinal stromal tumor measuring 4.3 cm;high-grade; mitotic rate greater than 5/50 High-Power field(10/50); margins negative; tumor strongly positive for CD117 (C-kit) and CD 34 and is negative smooth muscle actin, desmin and S100.  Initiation of Gleevec 07/01/2017, discontinued after 10 days secondary to a severe rash  Completion of Medrol Dosepak beginning 07/13/2017  Gleevec resumed at a dose of 100 mg daily 07/29/2017  Gleevec increased to 200 mg daily 08/11/2017 2. Hypothyroid  Disposition: Ms. Dechert appears well.  She is tolerating the 100 mg dose of Gleevec well.  The [place] will be escalated to 200 mg daily.  She will return for office and lab visit in 3 weeks.  She will contact us in the interim for a recurrent rash or new symptoms.  15 minutes were spent with the patient today.  The majority of the time was used for counseling and coordination of care.  Carrie Coder, MD  08/11/2017  9:20 AM

## 2017-08-11 NOTE — Telephone Encounter (Signed)
Appointments scheduled AVS/Calendar printed per 3/20 los 

## 2017-08-13 ENCOUNTER — Other Ambulatory Visit: Payer: Self-pay | Admitting: *Deleted

## 2017-08-13 DIAGNOSIS — C49A2 Gastrointestinal stromal tumor of stomach: Secondary | ICD-10-CM

## 2017-08-13 MED ORDER — IMATINIB MESYLATE 100 MG PO TABS: 200.0000 mg | ORAL_TABLET | Freq: Every day | ORAL | 0 refills | Status: DC

## 2017-08-13 NOTE — Telephone Encounter (Signed)
Message from pt reporting the specialty pharmacy will not fill Gleevec brand with the current script on file. Pt has co-pay card for [place]. Escribed new DAW script.

## 2017-08-17 DIAGNOSIS — C49A2 Gastrointestinal stromal tumor of stomach: Secondary | ICD-10-CM | POA: Diagnosis not present

## 2017-08-31 ENCOUNTER — Telehealth: Payer: Self-pay | Admitting: Oncology

## 2017-08-31 NOTE — Telephone Encounter (Signed)
Patient called to cancel she changing her care over to [facility]

## 2017-09-01 ENCOUNTER — Other Ambulatory Visit: Payer: Federal, State, Local not specified - PPO

## 2017-09-01 ENCOUNTER — Ambulatory Visit: Payer: Federal, State, Local not specified - PPO | Admitting: Oncology

## 2017-09-21 DIAGNOSIS — Z01419 Encounter for gynecological examination (general) (routine) without abnormal findings: Secondary | ICD-10-CM | POA: Diagnosis not present

## 2017-09-21 DIAGNOSIS — Z1231 Encounter for screening mammogram for malignant neoplasm of breast: Secondary | ICD-10-CM | POA: Diagnosis not present

## 2017-09-21 DIAGNOSIS — Z6820 Body mass index (BMI) 20.0-20.9, adult: Secondary | ICD-10-CM | POA: Diagnosis not present

## 2017-10-11 DIAGNOSIS — C49A2 Gastrointestinal stromal tumor of stomach: Secondary | ICD-10-CM | POA: Diagnosis not present

## 2017-10-11 DIAGNOSIS — C49A0 Gastrointestinal stromal tumor, unspecified site: Secondary | ICD-10-CM | POA: Diagnosis not present

## 2017-10-12 DIAGNOSIS — C49A2 Gastrointestinal stromal tumor of stomach: Secondary | ICD-10-CM | POA: Diagnosis not present

## 2017-10-12 DIAGNOSIS — R945 Abnormal results of liver function studies: Secondary | ICD-10-CM | POA: Diagnosis not present

## 2017-10-19 DIAGNOSIS — C49A2 Gastrointestinal stromal tumor of stomach: Secondary | ICD-10-CM | POA: Diagnosis not present

## 2017-10-28 DIAGNOSIS — C49A2 Gastrointestinal stromal tumor of stomach: Secondary | ICD-10-CM | POA: Diagnosis not present

## 2017-11-03 DIAGNOSIS — M8580 Other specified disorders of bone density and structure, unspecified site: Secondary | ICD-10-CM | POA: Diagnosis not present

## 2017-11-09 DIAGNOSIS — R748 Abnormal levels of other serum enzymes: Secondary | ICD-10-CM | POA: Diagnosis not present

## 2017-11-09 DIAGNOSIS — Z5111 Encounter for antineoplastic chemotherapy: Secondary | ICD-10-CM | POA: Diagnosis not present

## 2017-11-09 DIAGNOSIS — Z85028 Personal history of other malignant neoplasm of stomach: Secondary | ICD-10-CM | POA: Diagnosis not present

## 2017-11-09 DIAGNOSIS — C49A2 Gastrointestinal stromal tumor of stomach: Secondary | ICD-10-CM | POA: Diagnosis not present

## 2017-11-23 DIAGNOSIS — C49A2 Gastrointestinal stromal tumor of stomach: Secondary | ICD-10-CM | POA: Diagnosis not present

## 2017-11-23 DIAGNOSIS — R748 Abnormal levels of other serum enzymes: Secondary | ICD-10-CM | POA: Diagnosis not present

## 2017-12-03 DIAGNOSIS — R748 Abnormal levels of other serum enzymes: Secondary | ICD-10-CM | POA: Diagnosis not present

## 2017-12-03 DIAGNOSIS — E039 Hypothyroidism, unspecified: Secondary | ICD-10-CM | POA: Diagnosis not present

## 2017-12-03 DIAGNOSIS — C49A2 Gastrointestinal stromal tumor of stomach: Secondary | ICD-10-CM | POA: Diagnosis not present

## 2017-12-07 DIAGNOSIS — R945 Abnormal results of liver function studies: Secondary | ICD-10-CM | POA: Diagnosis not present

## 2017-12-07 DIAGNOSIS — Z5111 Encounter for antineoplastic chemotherapy: Secondary | ICD-10-CM | POA: Diagnosis not present

## 2017-12-07 DIAGNOSIS — R748 Abnormal levels of other serum enzymes: Secondary | ICD-10-CM | POA: Diagnosis not present

## 2017-12-07 DIAGNOSIS — C49A2 Gastrointestinal stromal tumor of stomach: Secondary | ICD-10-CM | POA: Diagnosis not present

## 2017-12-15 DIAGNOSIS — M8580 Other specified disorders of bone density and structure, unspecified site: Secondary | ICD-10-CM | POA: Diagnosis not present

## 2017-12-21 DIAGNOSIS — C49A2 Gastrointestinal stromal tumor of stomach: Secondary | ICD-10-CM | POA: Diagnosis not present

## 2018-01-03 DIAGNOSIS — K739 Chronic hepatitis, unspecified: Secondary | ICD-10-CM | POA: Diagnosis not present

## 2018-01-03 DIAGNOSIS — K740 Hepatic fibrosis: Secondary | ICD-10-CM | POA: Diagnosis not present

## 2018-01-03 DIAGNOSIS — K738 Other chronic hepatitis, not elsewhere classified: Secondary | ICD-10-CM | POA: Diagnosis not present

## 2018-01-03 DIAGNOSIS — K759 Inflammatory liver disease, unspecified: Secondary | ICD-10-CM | POA: Diagnosis not present

## 2018-01-03 DIAGNOSIS — K760 Fatty (change of) liver, not elsewhere classified: Secondary | ICD-10-CM | POA: Diagnosis not present

## 2018-01-11 DIAGNOSIS — B179 Acute viral hepatitis, unspecified: Secondary | ICD-10-CM | POA: Diagnosis not present

## 2018-01-31 DIAGNOSIS — C49A2 Gastrointestinal stromal tumor of stomach: Secondary | ICD-10-CM | POA: Diagnosis not present

## 2018-01-31 DIAGNOSIS — Z9889 Other specified postprocedural states: Secondary | ICD-10-CM | POA: Diagnosis not present

## 2018-02-01 DIAGNOSIS — K719 Toxic liver disease, unspecified: Secondary | ICD-10-CM | POA: Diagnosis not present

## 2018-02-01 DIAGNOSIS — K738 Other chronic hepatitis, not elsewhere classified: Secondary | ICD-10-CM | POA: Diagnosis not present

## 2018-02-01 DIAGNOSIS — R748 Abnormal levels of other serum enzymes: Secondary | ICD-10-CM | POA: Diagnosis not present

## 2018-02-01 DIAGNOSIS — C49A2 Gastrointestinal stromal tumor of stomach: Secondary | ICD-10-CM | POA: Diagnosis not present

## 2018-02-01 DIAGNOSIS — R945 Abnormal results of liver function studies: Secondary | ICD-10-CM | POA: Diagnosis not present

## 2018-02-10 DIAGNOSIS — M8580 Other specified disorders of bone density and structure, unspecified site: Secondary | ICD-10-CM | POA: Diagnosis not present

## 2018-02-15 DIAGNOSIS — C49A2 Gastrointestinal stromal tumor of stomach: Secondary | ICD-10-CM | POA: Diagnosis not present

## 2018-02-17 DIAGNOSIS — C49A0 Gastrointestinal stromal tumor, unspecified site: Secondary | ICD-10-CM | POA: Diagnosis not present

## 2018-02-17 DIAGNOSIS — C49A2 Gastrointestinal stromal tumor of stomach: Secondary | ICD-10-CM | POA: Diagnosis not present

## 2018-03-03 DIAGNOSIS — R748 Abnormal levels of other serum enzymes: Secondary | ICD-10-CM | POA: Diagnosis not present

## 2018-03-09 DIAGNOSIS — Z0000 Encounter for general adult medical examination without abnormal findings: Secondary | ICD-10-CM | POA: Diagnosis not present

## 2018-03-09 DIAGNOSIS — D2272 Melanocytic nevi of left lower limb, including hip: Secondary | ICD-10-CM | POA: Diagnosis not present

## 2018-03-09 DIAGNOSIS — L821 Other seborrheic keratosis: Secondary | ICD-10-CM | POA: Diagnosis not present

## 2018-03-09 DIAGNOSIS — E038 Other specified hypothyroidism: Secondary | ICD-10-CM | POA: Diagnosis not present

## 2018-03-09 DIAGNOSIS — R82998 Other abnormal findings in urine: Secondary | ICD-10-CM | POA: Diagnosis not present

## 2018-03-09 DIAGNOSIS — M859 Disorder of bone density and structure, unspecified: Secondary | ICD-10-CM | POA: Diagnosis not present

## 2018-03-09 DIAGNOSIS — L814 Other melanin hyperpigmentation: Secondary | ICD-10-CM | POA: Diagnosis not present

## 2018-03-09 DIAGNOSIS — D1801 Hemangioma of skin and subcutaneous tissue: Secondary | ICD-10-CM | POA: Diagnosis not present

## 2018-03-14 DIAGNOSIS — J341 Cyst and mucocele of nose and nasal sinus: Secondary | ICD-10-CM | POA: Diagnosis not present

## 2018-03-14 DIAGNOSIS — Z1212 Encounter for screening for malignant neoplasm of rectum: Secondary | ICD-10-CM | POA: Diagnosis not present

## 2018-03-16 DIAGNOSIS — E038 Other specified hypothyroidism: Secondary | ICD-10-CM | POA: Diagnosis not present

## 2018-03-16 DIAGNOSIS — Z0000 Encounter for general adult medical examination without abnormal findings: Secondary | ICD-10-CM | POA: Diagnosis not present

## 2018-03-16 DIAGNOSIS — C49A2 Gastrointestinal stromal tumor of stomach: Secondary | ICD-10-CM | POA: Diagnosis not present

## 2018-03-16 DIAGNOSIS — Z1389 Encounter for screening for other disorder: Secondary | ICD-10-CM | POA: Diagnosis not present

## 2018-03-16 DIAGNOSIS — K712 Toxic liver disease with acute hepatitis: Secondary | ICD-10-CM | POA: Diagnosis not present

## 2018-03-16 DIAGNOSIS — E7849 Other hyperlipidemia: Secondary | ICD-10-CM | POA: Diagnosis not present

## 2018-03-17 DIAGNOSIS — C49A2 Gastrointestinal stromal tumor of stomach: Secondary | ICD-10-CM | POA: Diagnosis not present

## 2018-03-31 DIAGNOSIS — R748 Abnormal levels of other serum enzymes: Secondary | ICD-10-CM | POA: Diagnosis not present

## 2018-04-27 DIAGNOSIS — R748 Abnormal levels of other serum enzymes: Secondary | ICD-10-CM | POA: Diagnosis not present

## 2018-04-28 DIAGNOSIS — R748 Abnormal levels of other serum enzymes: Secondary | ICD-10-CM | POA: Diagnosis not present

## 2018-05-10 DIAGNOSIS — R945 Abnormal results of liver function studies: Secondary | ICD-10-CM | POA: Diagnosis not present

## 2018-05-10 DIAGNOSIS — Z08 Encounter for follow-up examination after completed treatment for malignant neoplasm: Secondary | ICD-10-CM | POA: Diagnosis not present

## 2018-05-10 DIAGNOSIS — C49A2 Gastrointestinal stromal tumor of stomach: Secondary | ICD-10-CM | POA: Diagnosis not present

## 2018-05-10 DIAGNOSIS — Z85028 Personal history of other malignant neoplasm of stomach: Secondary | ICD-10-CM | POA: Diagnosis not present

## 2018-05-10 DIAGNOSIS — Z5111 Encounter for antineoplastic chemotherapy: Secondary | ICD-10-CM | POA: Diagnosis not present

## 2018-05-10 DIAGNOSIS — K719 Toxic liver disease, unspecified: Secondary | ICD-10-CM | POA: Diagnosis not present

## 2018-05-26 DIAGNOSIS — K719 Toxic liver disease, unspecified: Secondary | ICD-10-CM | POA: Diagnosis not present

## 2018-06-07 DIAGNOSIS — Z6820 Body mass index (BMI) 20.0-20.9, adult: Secondary | ICD-10-CM | POA: Diagnosis not present

## 2018-06-07 DIAGNOSIS — E8801 Alpha-1-antitrypsin deficiency: Secondary | ICD-10-CM | POA: Diagnosis not present

## 2018-06-09 DIAGNOSIS — R748 Abnormal levels of other serum enzymes: Secondary | ICD-10-CM | POA: Diagnosis not present

## 2018-06-09 DIAGNOSIS — C49A2 Gastrointestinal stromal tumor of stomach: Secondary | ICD-10-CM | POA: Diagnosis not present

## 2018-06-24 DIAGNOSIS — R748 Abnormal levels of other serum enzymes: Secondary | ICD-10-CM | POA: Diagnosis not present

## 2018-06-30 DIAGNOSIS — K719 Toxic liver disease, unspecified: Secondary | ICD-10-CM | POA: Diagnosis not present

## 2018-08-02 DIAGNOSIS — Z471 Aftercare following joint replacement surgery: Secondary | ICD-10-CM | POA: Diagnosis not present

## 2018-08-02 DIAGNOSIS — Z903 Acquired absence of stomach [part of]: Secondary | ICD-10-CM | POA: Diagnosis not present

## 2018-08-02 DIAGNOSIS — Z8500 Personal history of malignant neoplasm of unspecified digestive organ: Secondary | ICD-10-CM | POA: Diagnosis not present

## 2018-08-02 DIAGNOSIS — C49A2 Gastrointestinal stromal tumor of stomach: Secondary | ICD-10-CM | POA: Diagnosis not present

## 2018-08-28 IMAGING — CT CT ABD-PELV W/ CM
1 of 3 series · 13 of 32 positions shown, 18 images · IV contrast (iopamidol)
Comparison: None.

CLINICAL DATA: Right lower quadrant pressure prior to urination.

EXAM:
CT ABDOMEN AND PELVIS WITH CONTRAST
TECHNIQUE: Multidetector CT imaging of the abdomen and pelvis was performed
using the standard protocol following bolus administration of
intravenous contrast.
CONTRAST:  100mL [id number] IOPAMIDOL (Y1GAXR-G33) INJECTION 61%

[Series 2: routine abdomen/pelvis with · axial · 0.63mm/px · z∈[+548,+938]mm · 13 of 88 slices shown, 18 images]
[im 5/88  soft-tissue]
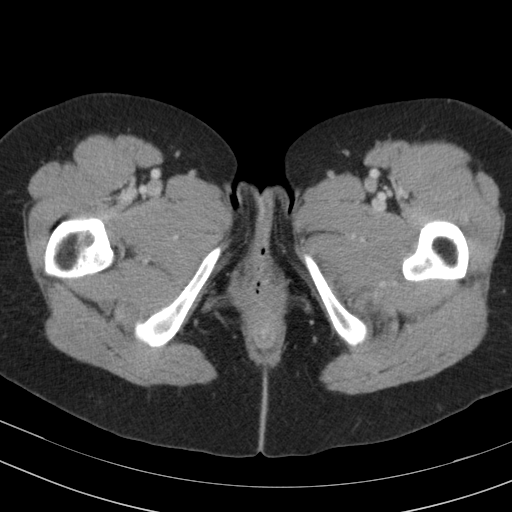
[im 5/88  bone]
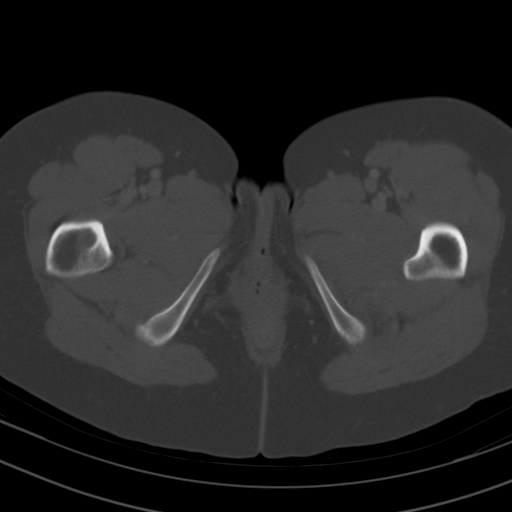
[im 14/88  soft-tissue]
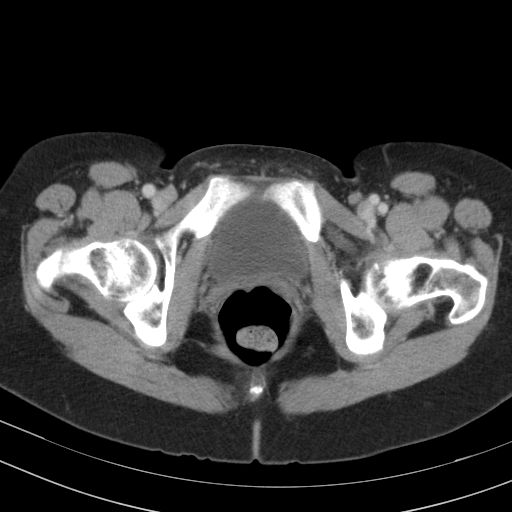
[im 18/88  soft-tissue]
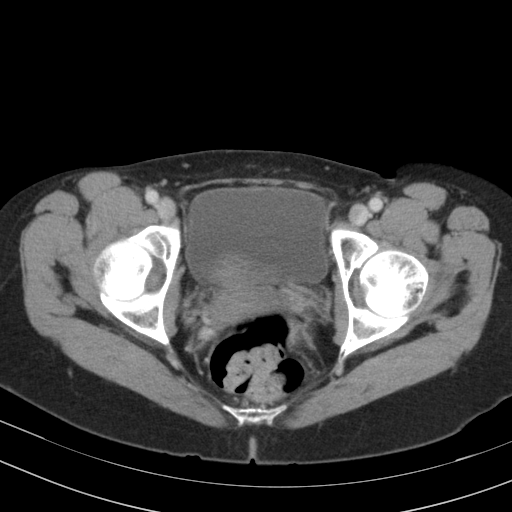
[im 27/88  soft-tissue]
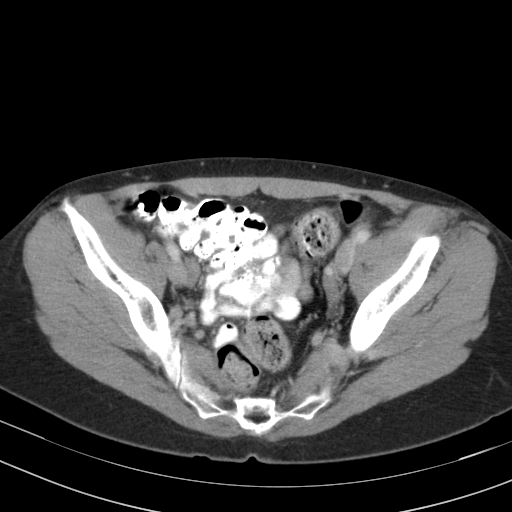
[im 35/88  soft-tissue]
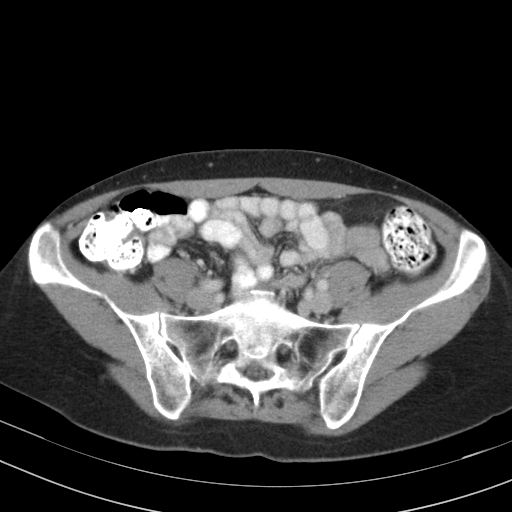
[im 40/88  soft-tissue]
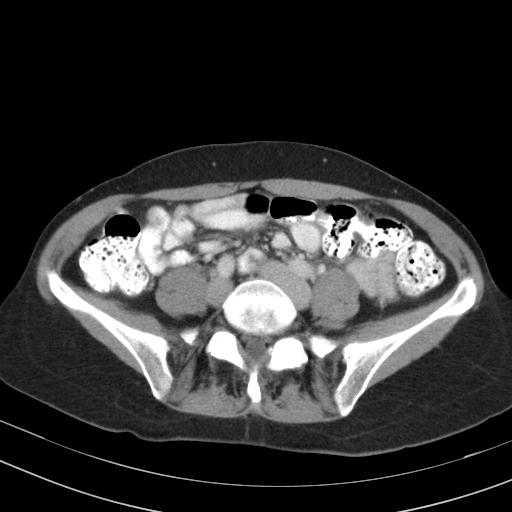
[im 48/88  soft-tissue]
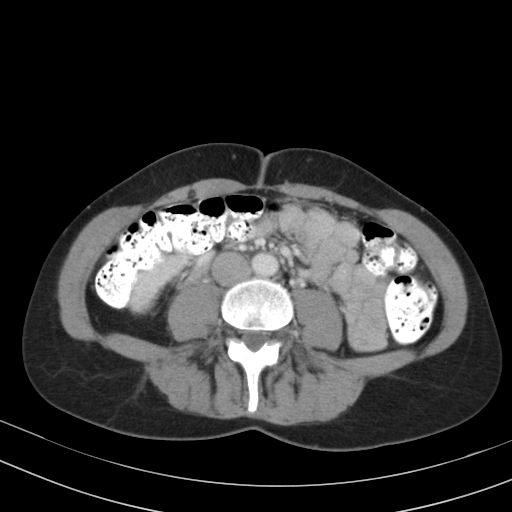
[im 53/88  soft-tissue]
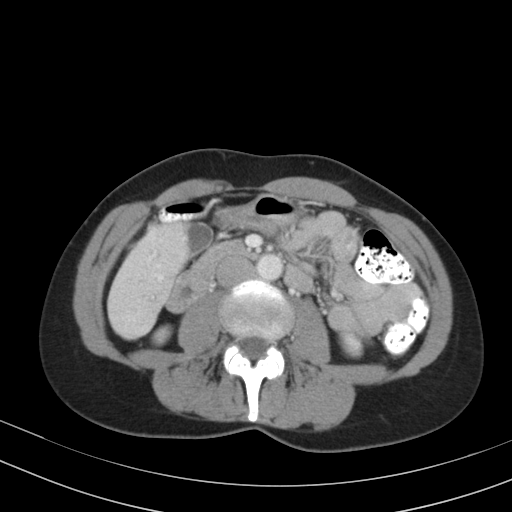
[im 61/88  soft-tissue]
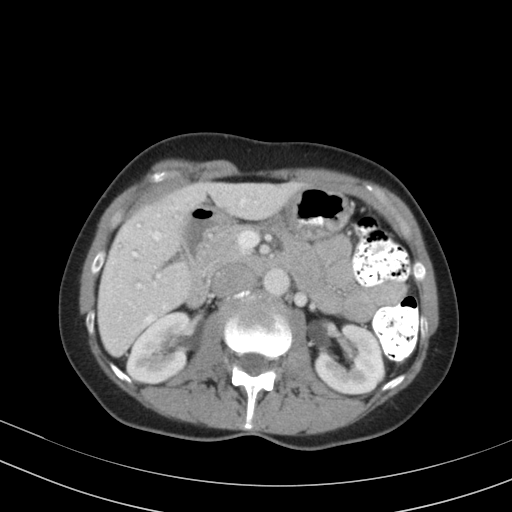
[im 61/88  bone]
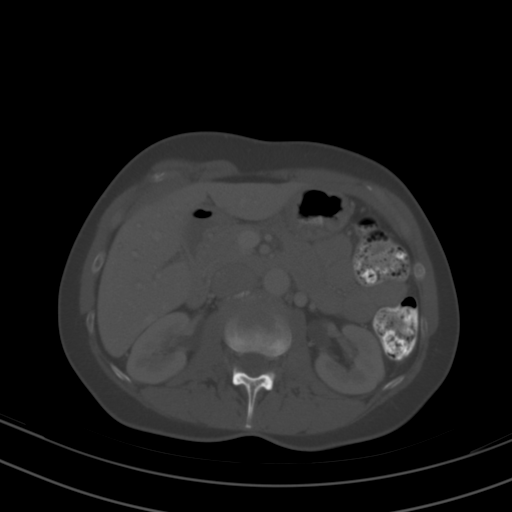
[im 70/88  soft-tissue]
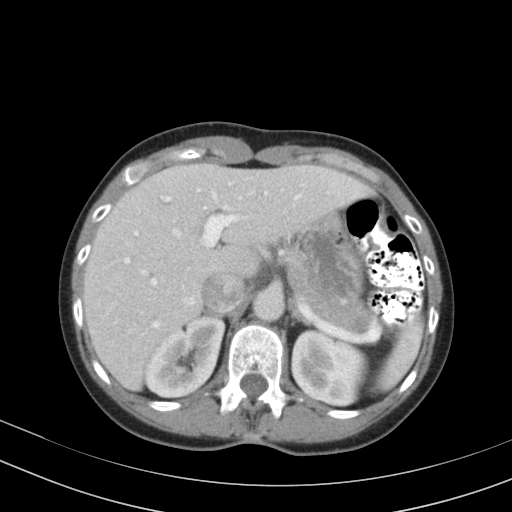
[im 70/88  lung]
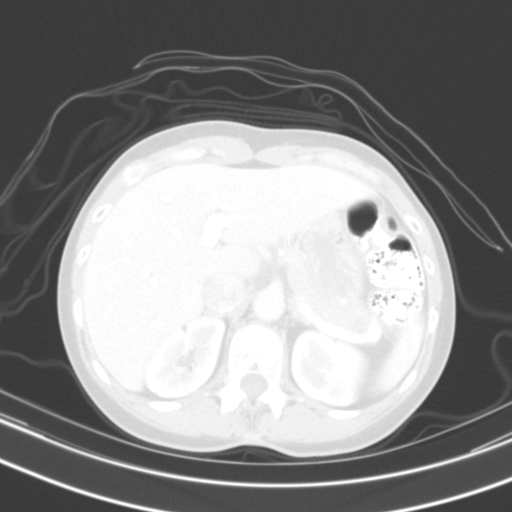
[im 74/88  soft-tissue]
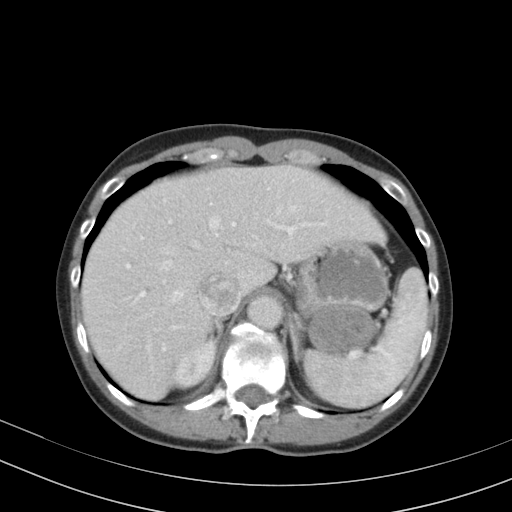
[im 74/88  lung]
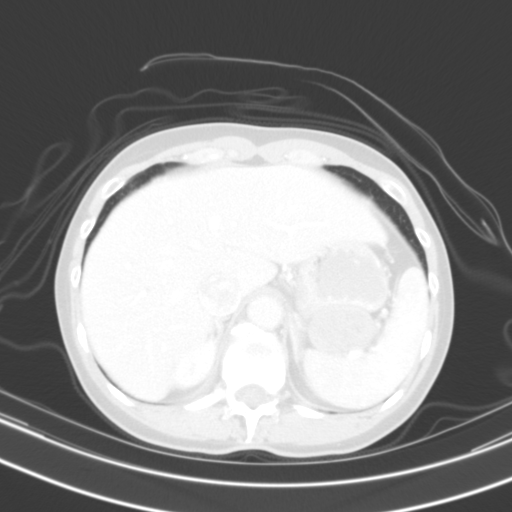
[im 79/88  lung]
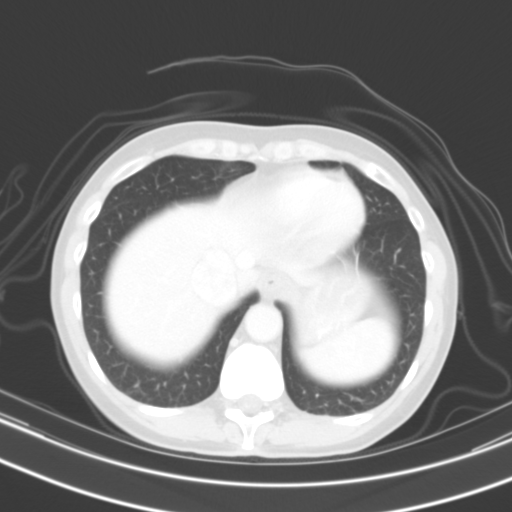
[im 83/88  soft-tissue]
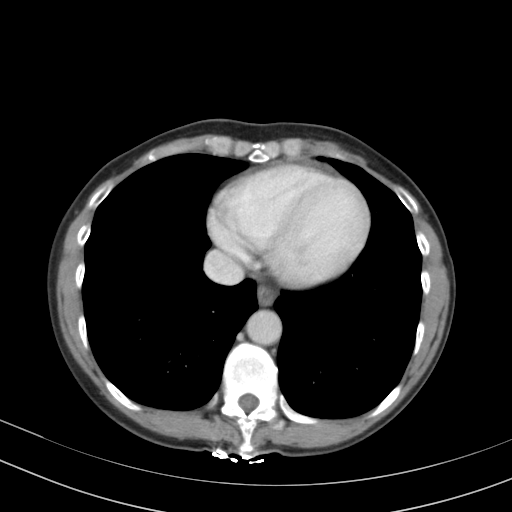
[im 83/88  lung]
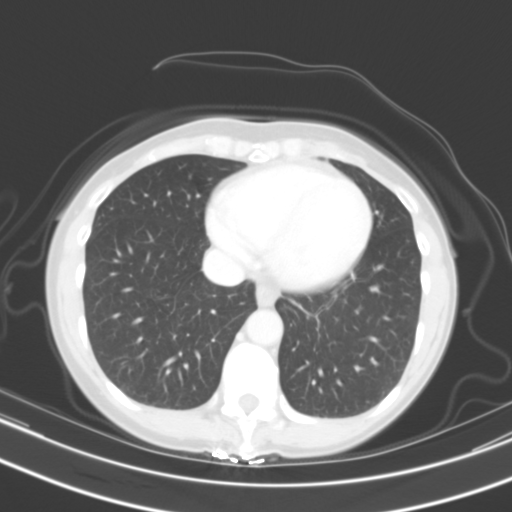

[13 of 32 positions shown; findings below may reference images not displayed]

FINDINGS: Lower chest: No acute abnormality.

Hepatobiliary: No focal liver abnormality is seen. No gallstones,
gallbladder wall thickening, or biliary dilatation.

Pancreas: Unremarkable. No pancreatic ductal dilatation or
surrounding inflammatory changes.

Spleen: Normal in size without focal abnormality.

Adrenals/Urinary Tract: The adrenal glands are normal. Normal
appearance of both kidneys. No mass or hydronephrosis. The urinary
bladder appears normal.

Stomach/Bowel: Along the posterior wall of the stomach there is a
solid mass measuring 4.3 x 2.9 x 3.0 cm. This is mostly exophytic
with a small sub mucosal component, 14 of series 2. No abnormal
distention of the stomach. The small bowel loops have a normal
course and caliber. No obstruction. Normal appearance of the colon.

Vascular/Lymphatic: Calcified atherosclerotic disease involves the
abdominal aorta. No aneurysm. No enlarged retroperitoneal or
mesenteric adenopathy. No enlarged pelvic or inguinal lymph nodes.

Reproductive: Uterus and bilateral adnexa are unremarkable.

Other: No free fluid or fluid collections.

Musculoskeletal: No acute or significant osseous findings.
IMPRESSION: 1. No acute findings identified within the abdomen or pelvis.
2. There is a solid-appearing mass in the left upper quadrant of the
abdomen associated with the posterior wall of the stomach. The
appearance favors (but not limited to) that of a gastrointestinal
stromal tumor (GIST). Other benign and malignant neoplasms are not
excluded. Further evaluation with endoscopic ultrasound and tissue
sampling is advised.
These results will be called to the ordering clinician or
representative by the Radiologist Assistant, and communication
documented in the PACS or zVision Dashboard.

## 2018-09-01 ENCOUNTER — Other Ambulatory Visit: Payer: Self-pay | Admitting: Cardiology

## 2018-11-21 DIAGNOSIS — C49A2 Gastrointestinal stromal tumor of stomach: Secondary | ICD-10-CM | POA: Diagnosis not present

## 2018-11-21 DIAGNOSIS — K719 Toxic liver disease, unspecified: Secondary | ICD-10-CM | POA: Diagnosis not present

## 2018-11-24 DIAGNOSIS — C49A2 Gastrointestinal stromal tumor of stomach: Secondary | ICD-10-CM | POA: Diagnosis not present

## 2018-11-29 DIAGNOSIS — C49A2 Gastrointestinal stromal tumor of stomach: Secondary | ICD-10-CM | POA: Diagnosis not present

## 2018-11-29 DIAGNOSIS — Z08 Encounter for follow-up examination after completed treatment for malignant neoplasm: Secondary | ICD-10-CM | POA: Diagnosis not present

## 2018-11-29 DIAGNOSIS — K719 Toxic liver disease, unspecified: Secondary | ICD-10-CM | POA: Diagnosis not present

## 2018-11-29 DIAGNOSIS — Z8500 Personal history of malignant neoplasm of unspecified digestive organ: Secondary | ICD-10-CM | POA: Diagnosis not present

## 2018-12-01 ENCOUNTER — Ambulatory Visit: Payer: Federal, State, Local not specified - PPO | Admitting: Sports Medicine

## 2018-12-01 ENCOUNTER — Other Ambulatory Visit: Payer: Self-pay

## 2018-12-01 ENCOUNTER — Ambulatory Visit: Payer: Self-pay

## 2018-12-01 VITALS — BP 100/60 | Ht 66.0 in | Wt 125.0 lb

## 2018-12-01 DIAGNOSIS — M7660 Achilles tendinitis, unspecified leg: Secondary | ICD-10-CM | POA: Diagnosis not present

## 2018-12-01 DIAGNOSIS — M7661 Achilles tendinitis, right leg: Secondary | ICD-10-CM | POA: Diagnosis not present

## 2018-12-01 DIAGNOSIS — M7662 Achilles tendinitis, left leg: Secondary | ICD-10-CM | POA: Diagnosis not present

## 2018-12-01 MED ORDER — NITROGLYCERIN 0.2 MG/HR TD PT24: MEDICATED_PATCH | TRANSDERMAL | 1 refills | Status: DC

## 2018-12-01 NOTE — Progress Notes (Signed)
CC: Post ankle pain  Now 2 months of post. Ankle pain Was very consistent going to gym Did a variety of exercises with no probs. When Gym shut down 2/2 COVID switched to walking several miles each day After 1 month had developed post. Ankle pain Got more severe so last 2 weeks primarily biking Pain lessened by 50% Now pain is 3 to 4/10 most days and worsens if she walks too much  Shoes are highly flexible with minimal drop and light  Past Hx GI stromal tumor required surgery and Chemo Elevated LFTs after TX required prednisone for several months Foot sport insoles for bunion and metatarsalgia in past  ROS No swelling in ankle joint No other joint pain  PE Pleasant thin F in NAD BP 100/60   Ht 5\' 6"  (1.676 m)   Wt 125 lb (56.7 kg)   BMI 20.18 kg/m   Ankle: R and L No visible erythema or swelling. Range of motion is full in all directions. Strength is 5/5 in all directions. Stable lateral and medial ligaments; squeeze test and kleiger test unremarkable; Talar dome nontender; No pain at base of 5th MT; No tenderness over cuboid; No tenderness over N spot or navicular prominence No tenderness on posterior aspects of lateral and medial malleolus No sign of peroneal tendon subluxations; Negative tarsal tunnel tinel's Able to walk 4 steps.  AT RT Tender to squeeze and feels thick 2 to 4 cm above calcaneus AT LT Nodular thickening and TTP 4 cm above calcaneus  Ultrasound of Achilles tendons Rt. Is 0.46 cms at insertion At 3 to 4 cm proximal is thickened at 0.90 cms ad modular On transverse scan multiple hypoechoic areas Marked neovessel activity  LT is 0.48 at iinsertion At 3 to 4 Cms above insertion Lt is 0.86 Hypoechoic change noted on transverse scan Moderate neovessel activity  Impression:  Bilateral nodular Achilles tendinopathy  Ultrasound and interpretation by Wolfgang Phoenix. Oneida Alar, MD

## 2018-12-01 NOTE — Patient Instructions (Signed)

## 2018-12-01 NOTE — Assessment & Plan Note (Signed)
Needs different shoes for walking Bike 2 more weeks Ease into walking NTG protocol [place] exercises  RTC 6 weeks

## 2018-12-13 DIAGNOSIS — H00011 Hordeolum externum right upper eyelid: Secondary | ICD-10-CM | POA: Diagnosis not present

## 2019-01-02 DIAGNOSIS — K719 Toxic liver disease, unspecified: Secondary | ICD-10-CM | POA: Diagnosis not present

## 2019-01-05 ENCOUNTER — Ambulatory Visit: Payer: Federal, State, Local not specified - PPO | Admitting: Sports Medicine

## 2019-01-05 ENCOUNTER — Other Ambulatory Visit: Payer: Self-pay

## 2019-01-05 VITALS — BP 110/70 | Ht 66.0 in | Wt 125.0 lb

## 2019-01-05 DIAGNOSIS — M7662 Achilles tendinitis, left leg: Secondary | ICD-10-CM

## 2019-01-05 DIAGNOSIS — M7661 Achilles tendinitis, right leg: Secondary | ICD-10-CM

## 2019-01-05 NOTE — Progress Notes (Signed)
PCP: Marton Redwood, MD  Subjective:   HPI: Patient is a 58 y.o. female here for f/u heel pain.    Patient was seen 5 weeks prior for bilateral heel pain after she had increased her walking regimen, found to have Achilles tendinitis.  Since then, the patient has had a decrease in her pain, only having 1/10 pain in her left heel/Achilles tendon.  She has been doing her exercises daily, wearing nitroglycerin patches to the heels, icing the heels twice daily, not walking, but has been riding her bike, doing yoga, and lifting weights.  The patient recently purchased a new pair of walking shoes and would like for them to be evaluated for proper fit.  She denies headaches, fevers, vision changes, shortness of breath, chest pain, nausea, vomiting, abdominal pain, falls, loss of sensation in her extremities, and extremity weaknesses.  Past Medical History:  Diagnosis Date  . Abnormal Pap smear    H/O  . History of gastrointestinal stromal tumor (GIST)   . Hypothyroidism   . Thyroid disease    hypothyroid    Current Outpatient Medications on File Prior to Visit  Medication Sig Dispense Refill  . CALCIUM PO Take 1,500 mg by mouth daily.     . Cholecalciferol (VITAMIN D PO) Take 2,000 Units by mouth daily.     . Multiple Vitamin (MULTIVITAMIN WITH MINERALS) TABS tablet Take 1 tablet daily by mouth.    . nitroGLYCERIN (NITRODUR - DOSED IN MG/24 HR) 0.2 mg/hr patch Use 1/4 patch daily to the affected area 30 patch 1  . SYNTHROID 75 MCG tablet Take 75 mcg daily before breakfast by mouth.     . zaleplon (SONATA) 10 MG capsule Take 10 mg at bedtime as needed by mouth for sleep.    Marland Kitchen imatinib (GLEEVEC) 100 MG tablet Take 2 tablets (200 mg total) by mouth daily. Take with meals and large glass of water.Caution:Chemotherapy 60 tablet 0  . prochlorperazine (COMPAZINE) 10 MG tablet Take 1 tablet (10 mg total) by mouth every 6 (six) hours as needed for nausea or vomiting. (Patient not taking: Reported on  07/13/2017) 30 tablet 0  . Red Yeast Rice Extract (RED YEAST RICE PO) Take 2 tablets daily by mouth.     No current facility-administered medications on file prior to visit.     Past Surgical History:  Procedure Laterality Date  . CERVICAL BIOPSY  W/ LOOP ELECTRODE EXCISION  2003  . cold knife conization  3/12  . EUS N/A 04/08/2017   Procedure: UPPER ENDOSCOPIC ULTRASOUND (EUS) LINEAR;  Surgeon: Milus Banister, MD;  Location: [facility];  Service: Endoscopy;  Laterality: N/A;  . LAPAROSCOPIC GASTRIC RESECTION N/A 06/01/2017   Procedure: LAPAROSCOPIC RESECTION GASTRIC OF GIST;  Surgeon: Excell Seltzer, MD;  Location: [facility];  Service: General;  Laterality: N/A;    Allergies  Allergen Reactions  . Fish Allergy     Stomach upset, diarrhea, ; SALMON  AND TUNA ; MOSTLY OILY FISHES     Social History   Socioeconomic History  . Marital status: Married    Spouse name: Not on file  . Number of children: Not on file  . Years of education: Not on file  . Highest education level: Not on file  Occupational History  . Not on file  Social Needs  . Financial resource strain: Not on file  . Food insecurity    Worry: Not on file    Inability: Not on file  . Transportation needs  Medical: Not on file    Non-medical: Not on file  Tobacco Use  . Smoking status: Never Smoker  . Smokeless tobacco: Never Used  Substance and Sexual Activity  . Alcohol use: No  . Drug use: No  . Sexual activity: Yes    Partners: Male  Lifestyle  . Physical activity    Days per week: Not on file    Minutes per session: Not on file  . Stress: Not on file  Relationships  . Social Herbalist on phone: Not on file    Gets together: Not on file    Attends religious service: Not on file    Active member of club or organization: Not on file    Attends meetings of clubs or organizations: Not on file    Relationship status: Not on file  . Intimate partner violence    Fear of current or ex  partner: Not on file    Emotionally abused: Not on file    Physically abused: Not on file    Forced sexual activity: Not on file  Other Topics Concern  . Not on file  Social History Narrative  . Not on file    Family History  Problem Relation Age of Onset  . Hypertension Mother   . Cancer Maternal Grandmother        breast  . Cancer Father        lung/melanoma  . Colon cancer Neg Hx   . Esophageal cancer Neg Hx   . Rectal cancer Neg Hx   . Stomach cancer Neg Hx     BP 110/70   Ht 5\' 6"  (1.676 m)   Wt 125 lb (56.7 kg)   BMI 20.18 kg/m   Review of Systems: See HPI above.     Objective:  Physical Exam:  Gen: NAD, comfortable in exam room Respiratory: Normal work of breathing  Left Achilles tendon, heel: -Inspection:  Small nodularity appreciated to distal Achilles,  No swelling, erythema, or bruising, normal Hallux ROM, no bunion appreciated -Palpation: No tenderness to palpation -ROM: Full  ROM of the ankle. Normal midfoot flexibility -Strength: 5/5 strength ankle in all planes -Neurovascular: N/V intact distally in the lower extremity -Special tests: Negative squeeze. Normal midfoot flexibility. Normal calcaneal motion with heel raise  Right Achilles tendon, heel: -Inspection:  Small nodularity noted about 4 cm above insertion of Achilles tendon, No swelling, erythema, or bruising. Small bunion, Hallux limitus in flexion -Palpation: No tenderness to palpation -ROM: Full  ROM of the ankle. Normal midfoot flexibility -Strength: 5/5 strength ankle in all planes -Neurovascular: N/V intact distally in the lower extremity -Special tests: Negative squeeze. normal midfoot flexibility. Normal calcaneal motion with heel raise   Assessment & Plan:  1. Achilles tendonitis, bilateral: improving with exercises, modification of activity, nitroglycerin patch and cold compresses -Patient allowed to walk: recommend walking 15 minutes daily, as long as patient walks 15 minutes for  3 days without pain she can increase by 5 minute intervals every 3 days of being pain free. -Continue to wear heel lifts in shoes -Encouraged patient to continue to do exercises, ice her heels, and use nitroglycerin patch  Follow up in 2 months for repeat imaging.  Milus Banister, [place], PGY-2 01/05/2019 2:17 PM  I observed and examined the patient with the resident and agree with assessment and plan.  Note reviewed and modified by me. Ila Mcgill, MD

## 2019-01-05 NOTE — Patient Instructions (Signed)
We saw you today for you bilateral ankle pain. We are glad to hear you are doing better!

## 2019-01-05 NOTE — Assessment & Plan Note (Signed)
Improving with exercises, modification of activity, nitroglycerin patch and cold compresses -Patient allowed to walk: recommend walking 15 minutes daily, as long as patient walks 15 minutes for 3 days without pain she can increase by 5 minute intervals every 3 days of being pain free. -Continue to wear heel lifts in shoes -Continue to do exercises, ice heels, and use nitroglycerin patch  Follow up in 2 months for repeat imaging.

## 2019-01-16 DIAGNOSIS — Z1231 Encounter for screening mammogram for malignant neoplasm of breast: Secondary | ICD-10-CM | POA: Diagnosis not present

## 2019-01-16 DIAGNOSIS — R8281 Pyuria: Secondary | ICD-10-CM | POA: Diagnosis not present

## 2019-01-16 DIAGNOSIS — Z6821 Body mass index (BMI) 21.0-21.9, adult: Secondary | ICD-10-CM | POA: Diagnosis not present

## 2019-01-16 DIAGNOSIS — N390 Urinary tract infection, site not specified: Secondary | ICD-10-CM | POA: Diagnosis not present

## 2019-01-16 DIAGNOSIS — Z01419 Encounter for gynecological examination (general) (routine) without abnormal findings: Secondary | ICD-10-CM | POA: Diagnosis not present

## 2019-01-17 DIAGNOSIS — Z01419 Encounter for gynecological examination (general) (routine) without abnormal findings: Secondary | ICD-10-CM | POA: Diagnosis not present

## 2019-01-20 ENCOUNTER — Other Ambulatory Visit: Payer: Self-pay | Admitting: Sports Medicine

## 2019-01-23 ENCOUNTER — Other Ambulatory Visit: Payer: Self-pay

## 2019-01-23 DIAGNOSIS — C49A2 Gastrointestinal stromal tumor of stomach: Secondary | ICD-10-CM | POA: Diagnosis not present

## 2019-01-27 DIAGNOSIS — T451X5D Adverse effect of antineoplastic and immunosuppressive drugs, subsequent encounter: Secondary | ICD-10-CM | POA: Diagnosis not present

## 2019-01-27 DIAGNOSIS — K719 Toxic liver disease, unspecified: Secondary | ICD-10-CM | POA: Diagnosis not present

## 2019-02-02 ENCOUNTER — Other Ambulatory Visit: Payer: Self-pay | Admitting: *Deleted

## 2019-02-02 MED ORDER — NITROGLYCERIN 0.2 MG/HR TD PT24: MEDICATED_PATCH | TRANSDERMAL | 0 refills | Status: DC

## 2019-02-15 ENCOUNTER — Other Ambulatory Visit: Payer: Self-pay

## 2019-02-15 ENCOUNTER — Ambulatory Visit
Admission: RE | Admit: 2019-02-15 | Discharge: 2019-02-15 | Disposition: A | Discharge status: Home / Self Care | Payer: Federal, State, Local not specified - PPO | Source: Ambulatory Visit | Attending: Family Medicine | Admitting: Family Medicine

## 2019-02-15 ENCOUNTER — Ambulatory Visit: Payer: Federal, State, Local not specified - PPO | Admitting: Family Medicine

## 2019-02-15 ENCOUNTER — Encounter: Payer: Self-pay | Admitting: Family Medicine

## 2019-02-15 VITALS — BP 122/78 | Ht 66.0 in | Wt 130.0 lb

## 2019-02-15 DIAGNOSIS — M79674 Pain in right toe(s): Secondary | ICD-10-CM

## 2019-02-15 DIAGNOSIS — S92511A Displaced fracture of proximal phalanx of right lesser toe(s), initial encounter for closed fracture: Secondary | ICD-10-CM | POA: Diagnosis not present

## 2019-02-15 NOTE — Patient Instructions (Addendum)
Please stop by [facility] imaging and get x-ray of your toe.   We will call you with the results.   Continue buddy taping and wearing postop shoe. Continue icing 3 times a day. Can use over-the-counter anti-inflammatory for pain relief We will see you back in 4 weeks at your appt with Dr. Oneida Alar

## 2019-02-15 NOTE — Progress Notes (Signed)
[facility] [address] [place], [place] [address] Phone: [phone] Fax: [phone]   Patient Name: Carrie Allison Date of Birth: February 24, 1961 Medical Record Number: [id number] Gender: female Date of Encounter: 02/15/2019  CC: Toe pain  HPI: Carrie Allison is a 58 year old female presenting with 1 day of right second toe pain and swelling.  Yesterday during yoga, she accidentally jammed her toe into her 10 pound weight.  She had immediate swelling and discomfort.  Has pain with direct pressure on the side of her toe.  Alleviating factors include ice and rest.  Has a history of tendinitis in that foot.   Past Medical History:  Diagnosis Date  . Abnormal Pap smear    H/O  . History of gastrointestinal stromal tumor (GIST)   . Hypothyroidism   . Thyroid disease    hypothyroid    Current Outpatient Medications on File Prior to Visit  Medication Sig Dispense Refill  . CALCIUM PO Take 1,500 mg by mouth daily.     . Cholecalciferol (VITAMIN D PO) Take 2,000 Units by mouth daily.     . Multiple Vitamin (MULTIVITAMIN WITH MINERALS) TABS tablet Take 1 tablet daily by mouth.    . nitroGLYCERIN (NITRODUR - DOSED IN MG/24 HR) 0.2 mg/hr patch APPLY 1/4 PATCH TO EACH ANKLE DAILY (1/2 PATCH TOTAL DAILY) 30 patch 0  . SYNTHROID 75 MCG tablet Take 75 mcg daily before breakfast by mouth.     . zaleplon (SONATA) 10 MG capsule Take 10 mg at bedtime as needed by mouth for sleep.     No current facility-administered medications on file prior to visit.     Past Surgical History:  Procedure Laterality Date  . CERVICAL BIOPSY  W/ LOOP ELECTRODE EXCISION  2003  . cold knife conization  3/12  . EUS N/A 04/08/2017   Procedure: UPPER ENDOSCOPIC ULTRASOUND (EUS) LINEAR;  Surgeon: Milus Banister, MD;  Location: [facility];  Service: Endoscopy;  Laterality: N/A;  . LAPAROSCOPIC GASTRIC RESECTION N/A 06/01/2017   Procedure: LAPAROSCOPIC RESECTION GASTRIC OF GIST;   Surgeon: Excell Seltzer, MD;  Location: [facility];  Service: General;  Laterality: N/A;    Allergies  Allergen Reactions  . Fish Allergy     Stomach upset, diarrhea, ; SALMON  AND TUNA ; MOSTLY OILY FISHES     Social History   Socioeconomic History  . Marital status: Married    Spouse name: Not on file  . Number of children: Not on file  . Years of education: Not on file  . Highest education level: Not on file  Occupational History  . Not on file  Social Needs  . Financial resource strain: Not on file  . Food insecurity    Worry: Not on file    Inability: Not on file  . Transportation needs    Medical: Not on file    Non-medical: Not on file  Tobacco Use  . Smoking status: Never Smoker  . Smokeless tobacco: Never Used  Substance and Sexual Activity  . Alcohol use: No  . Drug use: No  . Sexual activity: Yes    Partners: Male  Lifestyle  . Physical activity    Days per week: Not on file    Minutes per session: Not on file  . Stress: Not on file  Relationships  . Social Herbalist on phone: Not on file    Gets together: Not on file    Attends religious service: Not  on file    Active member of club or organization: Not on file    Attends meetings of clubs or organizations: Not on file    Relationship status: Not on file  . Intimate partner violence    Fear of current or ex partner: Not on file    Emotionally abused: Not on file    Physically abused: Not on file    Forced sexual activity: Not on file  Other Topics Concern  . Not on file  Social History Narrative  . Not on file    Family History  Problem Relation Age of Onset  . Hypertension Mother   . Cancer Maternal Grandmother        breast  . Cancer Father        lung/melanoma  . Colon cancer Neg Hx   . Esophageal cancer Neg Hx   . Rectal cancer Neg Hx   . Stomach cancer Neg Hx     BP 122/78   Ht 5\' 6"  (1.676 m)   Wt 130 lb (59 kg)   BMI 20.98 kg/m   ROS:  See HPI CONST: no F/C,  no malaise, no fatigue MSK: See above NEURO: no numbness/tingling, no weakness SKIN: no rash, no lesions HEME: See HPI  Objective: GEN: Alert and oriented, NAD Pulm: Breathing unlabored PSY: normal mood, congruent affect  MSK Right foot First ray bunion noted Swelling and ecchymosis at second toe, most pronounced at plantar side of MP joint. Decreased ROM in toe flexion TTP at proximal phalange 2nd digit with less TTP at middle and distal phalanx No TTP at midfoot Negative compression test NVI   Left foot No swelling or ecchymosis Full ROM No TTP Full strength  NVI  Assessment and Plan:  1. Right 2nd toe injury - will go ahead with radiographs to assess.  She was provided postop shoe and carbon fiber insert to help rest this.  Buddy taping, icing, otc medications.  F/u in 4 weeks with Dr. Oneida Alar.  We will call her with x-ray results.   Lanier Clam, DO, ATC Sports Medicine Fellow

## 2019-02-27 DIAGNOSIS — H00025 Hordeolum internum left lower eyelid: Secondary | ICD-10-CM | POA: Diagnosis not present

## 2019-03-01 ENCOUNTER — Encounter: Payer: Self-pay | Admitting: Family Medicine

## 2019-03-01 ENCOUNTER — Ambulatory Visit (INDEPENDENT_AMBULATORY_CARE_PROVIDER_SITE_OTHER): Payer: Federal, State, Local not specified - PPO | Admitting: Family Medicine

## 2019-03-01 ENCOUNTER — Other Ambulatory Visit: Payer: Self-pay

## 2019-03-01 VITALS — BP 116/64 | Ht 66.0 in | Wt 130.0 lb

## 2019-03-01 DIAGNOSIS — M79674 Pain in right toe(s): Secondary | ICD-10-CM | POA: Diagnosis not present

## 2019-03-01 NOTE — Progress Notes (Signed)
[facility] [address] [place], [place] [address] Phone: [phone] Fax: [phone]   Patient Name: Carrie Allison Date of Birth: 31-May-1960 Medical Record Number: [id number] Gender: female Date of Encounter: 03/01/2019  CC: Toe pain  HPI: 9/23: Carrie Allison is a 58 year old female presenting with 1 day of right second toe pain and swelling.  Yesterday during yoga, she accidentally jammed her toe into her 10 pound weight.  She had immediate swelling and discomfort.  Has pain with direct pressure on the side of her toe.  Alleviating factors include ice and rest.  Has a history of tendinitis in that foot.  10/7: Patient reports she's doing well. Not much pain in her 2nd digit. Has been buddy taping and wearing postop shoe. Improved swelling.    Past Medical History:  Diagnosis Date  . Abnormal Pap smear    H/O  . History of gastrointestinal stromal tumor (GIST)   . Hypothyroidism   . Thyroid disease    hypothyroid    Current Outpatient Medications on File Prior to Visit  Medication Sig Dispense Refill  . CALCIUM PO Take 1,500 mg by mouth daily.     . Cholecalciferol (VITAMIN D PO) Take 2,000 Units by mouth daily.     . Multiple Vitamin (MULTIVITAMIN WITH MINERALS) TABS tablet Take 1 tablet daily by mouth.    . nitroGLYCERIN (NITRODUR - DOSED IN MG/24 HR) 0.2 mg/hr patch APPLY 1/4 PATCH TO EACH ANKLE DAILY (1/2 PATCH TOTAL DAILY) 30 patch 0  . SYNTHROID 75 MCG tablet Take 75 mcg daily before breakfast by mouth.     . zaleplon (SONATA) 10 MG capsule Take 10 mg at bedtime as needed by mouth for sleep.     No current facility-administered medications on file prior to visit.     Past Surgical History:  Procedure Laterality Date  . CERVICAL BIOPSY  W/ LOOP ELECTRODE EXCISION  2003  . cold knife conization  3/12  . EUS N/A 04/08/2017   Procedure: UPPER ENDOSCOPIC ULTRASOUND (EUS) LINEAR;  Surgeon: Milus Banister, MD;  Location: [facility];  Service: Endoscopy;  Laterality: N/A;  . LAPAROSCOPIC GASTRIC RESECTION N/A 06/01/2017   Procedure: LAPAROSCOPIC RESECTION GASTRIC OF GIST;  Surgeon: Excell Seltzer, MD;  Location: [facility];  Service: General;  Laterality: N/A;    Allergies  Allergen Reactions  . Imatinib Rash    Affected liver  . Fish Allergy     Stomach upset, diarrhea, ; SALMON  AND TUNA ; MOSTLY OILY FISHES     Social History   Socioeconomic History  . Marital status: Married    Spouse name: Not on file  . Number of children: Not on file  . Years of education: Not on file  . Highest education level: Not on file  Occupational History  . Not on file  Social Needs  . Financial resource strain: Not on file  . Food insecurity    Worry: Not on file    Inability: Not on file  . Transportation needs    Medical: Not on file    Non-medical: Not on file  Tobacco Use  . Smoking status: Never Smoker  . Smokeless tobacco: Never Used  Substance and Sexual Activity  . Alcohol use: No  . Drug use: No  . Sexual activity: Yes    Partners: Male  Lifestyle  . Physical activity    Days per week: Not on file    Minutes per session: Not on file  .  Stress: Not on file  Relationships  . Social Herbalist on phone: Not on file    Gets together: Not on file    Attends religious service: Not on file    Active member of club or organization: Not on file    Attends meetings of clubs or organizations: Not on file    Relationship status: Not on file  . Intimate partner violence    Fear of current or ex partner: Not on file    Emotionally abused: Not on file    Physically abused: Not on file    Forced sexual activity: Not on file  Other Topics Concern  . Not on file  Social History Narrative  . Not on file    Family History  Problem Relation Age of Onset  . Hypertension Mother   . Cancer Maternal Grandmother        breast  . Cancer Father        lung/melanoma  . Colon cancer Neg Hx   .  Esophageal cancer Neg Hx   . Rectal cancer Neg Hx   . Stomach cancer Neg Hx     BP 116/64   Ht 5\' 6"  (1.676 m)   Wt 130 lb (59 kg)   BMI 20.98 kg/m   ROS:  See HPI CONST: no F/C, no malaise, no fatigue MSK: See above NEURO: no numbness/tingling, no weakness SKIN: no rash, no lesions HEME: See HPI  Objective: Gen: NAD, comfortable in exam room  Right foot: No angulation or malrotation.  Mild swelling localized about prox phalanx 2nd digit. FROM ankle with 5/5 strength.  Able to flex and extend all digits. Mild tenderness to palpation proximal phalanx 2nd digit. NVI distally.  Assessment and Plan:  1. Right 2nd toe injury - independently reviewed radiographs noting obliquely oriented proximal phalanx fracture.  Brief MSK u/s performed today with no additional displacement compared to prior visit.  Neovascularity noted at fracture site.  No early callus yet but clinically healing.  Continue buddy taping, postop shoe for 2 more weeks.  Icing, tylenol, ibuprofen if needed.  Suspect at f/u in 2 weeks she will be clinically healed enough to discontinue postop shoe but continue buddy taping for 2 more weeks.

## 2019-03-01 NOTE — Patient Instructions (Addendum)
Your proximal phalanx fracture is healing well clinically. Continue with buddy taping, postop shoe for 2 more weeks - keep your follow-up with Dr. Oneida Alar then. Icing, tylenol, ibuprofen only if needed.

## 2019-03-06 DIAGNOSIS — K719 Toxic liver disease, unspecified: Secondary | ICD-10-CM | POA: Diagnosis not present

## 2019-03-06 DIAGNOSIS — Z8509 Personal history of malignant neoplasm of other digestive organs: Secondary | ICD-10-CM | POA: Diagnosis not present

## 2019-03-06 DIAGNOSIS — Z08 Encounter for follow-up examination after completed treatment for malignant neoplasm: Secondary | ICD-10-CM | POA: Diagnosis not present

## 2019-03-06 DIAGNOSIS — C49A2 Gastrointestinal stromal tumor of stomach: Secondary | ICD-10-CM | POA: Diagnosis not present

## 2019-03-07 DIAGNOSIS — Z903 Acquired absence of stomach [part of]: Secondary | ICD-10-CM | POA: Diagnosis not present

## 2019-03-07 DIAGNOSIS — C49A2 Gastrointestinal stromal tumor of stomach: Secondary | ICD-10-CM | POA: Diagnosis not present

## 2019-03-09 ENCOUNTER — Ambulatory Visit: Payer: Federal, State, Local not specified - PPO | Admitting: Family Medicine

## 2019-03-09 ENCOUNTER — Ambulatory Visit: Payer: Federal, State, Local not specified - PPO | Admitting: Sports Medicine

## 2019-03-10 DIAGNOSIS — Z08 Encounter for follow-up examination after completed treatment for malignant neoplasm: Secondary | ICD-10-CM | POA: Diagnosis not present

## 2019-03-10 DIAGNOSIS — C49A2 Gastrointestinal stromal tumor of stomach: Secondary | ICD-10-CM | POA: Diagnosis not present

## 2019-03-10 DIAGNOSIS — Z8509 Personal history of malignant neoplasm of other digestive organs: Secondary | ICD-10-CM | POA: Diagnosis not present

## 2019-03-15 DIAGNOSIS — E038 Other specified hypothyroidism: Secondary | ICD-10-CM | POA: Diagnosis not present

## 2019-03-15 DIAGNOSIS — Z23 Encounter for immunization: Secondary | ICD-10-CM | POA: Diagnosis not present

## 2019-03-15 DIAGNOSIS — Z0000 Encounter for general adult medical examination without abnormal findings: Secondary | ICD-10-CM | POA: Diagnosis not present

## 2019-03-15 DIAGNOSIS — E7849 Other hyperlipidemia: Secondary | ICD-10-CM | POA: Diagnosis not present

## 2019-03-15 DIAGNOSIS — M859 Disorder of bone density and structure, unspecified: Secondary | ICD-10-CM | POA: Diagnosis not present

## 2019-03-16 ENCOUNTER — Other Ambulatory Visit: Payer: Self-pay

## 2019-03-16 ENCOUNTER — Ambulatory Visit (INDEPENDENT_AMBULATORY_CARE_PROVIDER_SITE_OTHER): Payer: Federal, State, Local not specified - PPO | Admitting: Sports Medicine

## 2019-03-16 VITALS — BP 110/68 | Ht 66.0 in | Wt 130.0 lb

## 2019-03-16 DIAGNOSIS — S92514A Nondisplaced fracture of proximal phalanx of right lesser toe(s), initial encounter for closed fracture: Secondary | ICD-10-CM | POA: Insufficient documentation

## 2019-03-16 DIAGNOSIS — S92514D Nondisplaced fracture of proximal phalanx of right lesser toe(s), subsequent encounter for fracture with routine healing: Secondary | ICD-10-CM | POA: Diagnosis not present

## 2019-03-16 DIAGNOSIS — M7662 Achilles tendinitis, left leg: Secondary | ICD-10-CM | POA: Diagnosis not present

## 2019-03-16 DIAGNOSIS — M7661 Achilles tendinitis, right leg: Secondary | ICD-10-CM | POA: Diagnosis not present

## 2019-03-16 NOTE — Patient Instructions (Signed)
Continue to wear your walking boot for 2 weeks.  Then you can move to a shoe, you must buddy tape or use the metal plate whenever you use this.  Then 2 weeks later (4 weeks from now) come back for ultrasound.  Continue to try heel raises on your left side.

## 2019-03-16 NOTE — Assessment & Plan Note (Signed)
S/p 16 days since fracture occurred.  No significant healing noted on x-ray, but is still very early.  Will have patient continue to walk in postop shoe for 2 weeks, then she can use either metal plate or buddy taping in a regular shoe for walking for 2 weeks.  Then patient to follow-up 4 weeks from today for repeat ultrasound to assess healing.  No further angulation or displacement noted on ultrasound today.

## 2019-03-16 NOTE — Assessment & Plan Note (Signed)
Symptoms improved since last visit.  Now currently without pain.  Ultrasound with improvement in neovascularization compared to previous on 12/01/2018.  Thickening also improved to bilateral Achilles with decreased size.  Given patient's right second toe fracture, she has been unable to perform heel raises on right, encouraged her to continue to do these on left and continue walking as tolerated.  Will follow-up in 4 weeks.  Can continue nitro patches.

## 2019-03-16 NOTE — Progress Notes (Signed)
Carrie Allison is a 58 y.o. female who presents to [facility] today for the following  F/U Right Second Toe Fracture Occurred on 10/6 during yoga, toe jammed into a weight XR with obliquely oriented proximal phalanx fracture Pain improved, wearing postop shoe and buddy taping Has a metal plate in shoes while riding bike No pain at all while riding bike Has not needed to take anything for pain Still thinks it is a little bit swollen Icing it 1-2x a day  F/U Bilateral Achilles Tendinitis Hasn't been able to do exercises because of broken toe Hasn't been walking much either because of toe Thinks that pain has improved in last month Few weeks ago, had some pain in Left heel>right Still using nitroglycerin patches QD and isn't sure if they are helping because it hasn't been hurting much No headaches with nitro patches, tolerating them well Rates pain 0/10 at present  Interim PMH reviewed. ROS as above. Medications reviewed.  Exam:  BP 110/68   Ht 5\' 6"  (1.676 m)   Wt 130 lb (59 kg)   BMI 20.98 kg/m  Gen: Well NAD  Right Foot: Inspection:  No obvious bony deformity.  Faint bruising noted on proximal phalanx of right second toe.  No swelling, erythema.  Normal arch Palpation: Mild tenderness to palpation of proximal phalanx of right second toe ROM: Full  ROM of the ankle. Normal midfoot flexibility Strength: 5/5 strength ankle in all planes Neurovascular: N/V intact distally in the lower extremity Special tests: Negative squeeze. normal midfoot flexibility.   Bilateral ankle: - Inspection: No obvious deformity, erythema, swelling, or ecchymosis, ulcers, calluses, blisters other than noted on right foot exam, bilaterally - Palpation: No TTP at MT heads, no TTP at base of 5th MT, no TTP over cuboid, no tenderness over navicular prominence, no TTP over lateral or medial malleolus.  No sign of peroneal tendon subluxation or TTP.  Small nodularity noted left proximal Achilles tendon, no  nodularity noted on right.  No tenderness palpation of Achilles tendon bilaterally. - Strength: Normal strength with dorsiflexion, plantarflexion, inversion, and eversion of foot - ROM: Full ROM - Neuro/vasc: NV intact - Special Tests: Negative anterior drawer, normal inversion test.  Negative syndesmotic compression.    Dg Toe 2nd Right  Result Date: 02/15/2019 CLINICAL DATA:  Second toe pain EXAM: RIGHT SECOND TOE COMPARISON:  None. FINDINGS: There is a minimally displaced, oblique fracture of the mid right second proximal phalanx. Joint spaces are preserved. Soft tissues are unremarkable. IMPRESSION: There is a minimally displaced, oblique fracture of the mid right second proximal phalanx. Electronically Signed   By: Eddie Candle M.D.   On: 02/15/2019 15:47   Brief Limited US Right 2nd toe: Visualized proximal phalanx in longitudinal and transverse plane.  Noted continued fracture with no additional displacement compared to prior visit.  Limited ultrasound bilateral Achilles: Length of bilateral Achilles tendons visualized in longitudinal and transverse planes.  Increased vascularization previously seen on ultrasound now greatly decreased.  Size of bilateral Achilles also decreased, right now about 0.7 cm, left now about 0.6 cm at 2 to 4 cm above insertion -, previously was 0.9 cm.  Assessment and Plan: 1) Achilles tendinitis of both lower extremities Symptoms improved since last visit.  Now currently without pain.  Ultrasound with improvement in neovascularization compared to previous on 12/01/2018.  Thickening also improved to bilateral Achilles with decreased size.  Given patient's right second toe fracture, she has been unable to perform heel raises on right, encouraged  her to continue to do these on left and continue walking as tolerated.  Will follow-up in 4 weeks.  Can continue nitro patches.  Closed nondisplaced fracture of proximal phalanx of lesser toe of right foot S/p 16 days  since fracture occurred.  No significant healing noted on x-ray, but is still very early.  Will have patient continue to walk in postop shoe for 2 weeks, then she can use either metal plate or buddy taping in a regular shoe for walking for 2 weeks.  Then patient to follow-up 4 weeks from today for repeat ultrasound to assess healing.  No further angulation or displacement noted on ultrasound today.   [place] Constable, D.O.  PGY-2 Family Medicine  03/16/2019 3:50 PM  I observed and examined the patient with the resident and agree with assessment and plan.  Note reviewed and modified by me. Ila Mcgill, MD

## 2019-03-17 DIAGNOSIS — R82998 Other abnormal findings in urine: Secondary | ICD-10-CM | POA: Diagnosis not present

## 2019-03-20 DIAGNOSIS — Z1212 Encounter for screening for malignant neoplasm of rectum: Secondary | ICD-10-CM | POA: Diagnosis not present

## 2019-03-22 DIAGNOSIS — C49A2 Gastrointestinal stromal tumor of stomach: Secondary | ICD-10-CM | POA: Diagnosis not present

## 2019-03-22 DIAGNOSIS — E039 Hypothyroidism, unspecified: Secondary | ICD-10-CM | POA: Diagnosis not present

## 2019-03-22 DIAGNOSIS — Z9189 Other specified personal risk factors, not elsewhere classified: Secondary | ICD-10-CM | POA: Diagnosis not present

## 2019-03-22 DIAGNOSIS — Z1331 Encounter for screening for depression: Secondary | ICD-10-CM | POA: Diagnosis not present

## 2019-03-22 DIAGNOSIS — Z0000 Encounter for general adult medical examination without abnormal findings: Secondary | ICD-10-CM | POA: Diagnosis not present

## 2019-03-22 DIAGNOSIS — E785 Hyperlipidemia, unspecified: Secondary | ICD-10-CM | POA: Diagnosis not present

## 2019-03-23 DIAGNOSIS — L820 Inflamed seborrheic keratosis: Secondary | ICD-10-CM | POA: Diagnosis not present

## 2019-03-23 DIAGNOSIS — L821 Other seborrheic keratosis: Secondary | ICD-10-CM | POA: Diagnosis not present

## 2019-03-23 DIAGNOSIS — D1801 Hemangioma of skin and subcutaneous tissue: Secondary | ICD-10-CM | POA: Diagnosis not present

## 2019-03-23 DIAGNOSIS — D225 Melanocytic nevi of trunk: Secondary | ICD-10-CM | POA: Diagnosis not present

## 2019-03-23 DIAGNOSIS — L814 Other melanin hyperpigmentation: Secondary | ICD-10-CM | POA: Diagnosis not present

## 2019-04-08 ENCOUNTER — Other Ambulatory Visit: Payer: Self-pay | Admitting: Sports Medicine

## 2019-04-18 ENCOUNTER — Encounter: Payer: Self-pay | Admitting: Sports Medicine

## 2019-04-18 ENCOUNTER — Other Ambulatory Visit: Payer: Self-pay

## 2019-04-18 ENCOUNTER — Ambulatory Visit (INDEPENDENT_AMBULATORY_CARE_PROVIDER_SITE_OTHER): Payer: Federal, State, Local not specified - PPO | Admitting: Sports Medicine

## 2019-04-18 VITALS — BP 104/58 | Ht 66.0 in | Wt 130.0 lb

## 2019-04-18 DIAGNOSIS — S92514D Nondisplaced fracture of proximal phalanx of right lesser toe(s), subsequent encounter for fracture with routine healing: Secondary | ICD-10-CM | POA: Diagnosis not present

## 2019-04-18 DIAGNOSIS — M7662 Achilles tendinitis, left leg: Secondary | ICD-10-CM | POA: Diagnosis not present

## 2019-04-18 DIAGNOSIS — M7661 Achilles tendinitis, right leg: Secondary | ICD-10-CM | POA: Diagnosis not present

## 2019-04-18 NOTE — Progress Notes (Signed)
[facility] [address] [place], [place] [address] Phone: [phone] Fax: [phone]   Patient Name: Carrie Allison Date of Birth: 11/09/1960 Medical Record Number: [id number] Gender: female Date of Encounter: 04/18/2019  CC: Toe fracture and bilateral Achilles tendinitis  HPI: Right second proximal phalanx fracture Occurred on 02/28/19 Healing well, transitioned to steel toe insert No pain when cycling Not requiring daily medication Continues to ice No new swelling or radiating pain  Bilateral Achilles tendinosis Has subsequently been resting secondary to problem 1 so does not have any pain today Not using medication No radiating symptoms into her toes or up her leg  Past Medical History:  Diagnosis Date  . Abnormal Pap smear    H/O  . History of gastrointestinal stromal tumor (GIST)   . Hypothyroidism   . Thyroid disease    hypothyroid    Current Outpatient Medications on File Prior to Visit  Medication Sig Dispense Refill  . CALCIUM PO Take 1,500 mg by mouth daily.     . Cholecalciferol (VITAMIN D PO) Take 2,000 Units by mouth daily.     . Multiple Vitamin (MULTIVITAMIN WITH MINERALS) TABS tablet Take 1 tablet daily by mouth.    . nitroGLYCERIN (NITRODUR - DOSED IN MG/24 HR) 0.2 mg/hr patch UNWRAP AND APPLY 1/4 PATCH TO EACH ANKLE DAILY.(1/2 PATCH TOTAL DAILY) 45 patch 1  . SYNTHROID 75 MCG tablet Take 75 mcg daily before breakfast by mouth.     . zaleplon (SONATA) 10 MG capsule Take 10 mg at bedtime as needed by mouth for sleep.     No current facility-administered medications on file prior to visit.     Past Surgical History:  Procedure Laterality Date  . CERVICAL BIOPSY  W/ LOOP ELECTRODE EXCISION  2003  . cold knife conization  3/12  . EUS N/A 04/08/2017   Procedure: UPPER ENDOSCOPIC ULTRASOUND (EUS) LINEAR;  Surgeon: Milus Banister, MD;  Location: [facility];  Service: Endoscopy;  Laterality: N/A;  .  LAPAROSCOPIC GASTRIC RESECTION N/A 06/01/2017   Procedure: LAPAROSCOPIC RESECTION GASTRIC OF GIST;  Surgeon: Excell Seltzer, MD;  Location: [facility];  Service: General;  Laterality: N/A;    Allergies  Allergen Reactions  . Imatinib Rash    Affected liver  . Fish Allergy     Stomach upset, diarrhea, ; SALMON  AND TUNA ; MOSTLY OILY FISHES     Social History   Socioeconomic History  . Marital status: Married    Spouse name: Not on file  . Number of children: Not on file  . Years of education: Not on file  . Highest education level: Not on file  Occupational History  . Not on file  Social Needs  . Financial resource strain: Not on file  . Food insecurity    Worry: Not on file    Inability: Not on file  . Transportation needs    Medical: Not on file    Non-medical: Not on file  Tobacco Use  . Smoking status: Never Smoker  . Smokeless tobacco: Never Used  Substance and Sexual Activity  . Alcohol use: No  . Drug use: No  . Sexual activity: Yes    Partners: Male  Lifestyle  . Physical activity    Days per week: Not on file    Minutes per session: Not on file  . Stress: Not on file  Relationships  . Social connections    Talks on phone: Not on file  Gets together: Not on file    Attends religious service: Not on file    Active member of club or organization: Not on file    Attends meetings of clubs or organizations: Not on file    Relationship status: Not on file  . Intimate partner violence    Fear of current or ex partner: Not on file    Emotionally abused: Not on file    Physically abused: Not on file    Forced sexual activity: Not on file  Other Topics Concern  . Not on file  Social History Narrative  . Not on file    Family History  Problem Relation Age of Onset  . Hypertension Mother   . Cancer Maternal Grandmother        breast  . Cancer Father        lung/melanoma  . Colon cancer Neg Hx   . Esophageal cancer Neg Hx   . Rectal cancer Neg Hx   .  Stomach cancer Neg Hx     BP (!) 104/58   Ht 5\' 6"  (1.676 m)   Wt 130 lb (59 kg)   BMI 20.98 kg/m   ROS:  See HPI CONST: no F/C, no malaise, no fatigue MSK: See above NEURO: no numbness/tingling, no weakness SKIN: no rash, no lesions HEME: no bleeding, no bruising, no erythema  Objective: GEN: Alert and oriented, NAD Pulm: Breathing unlabored PSY: normal mood, congruent affect  Right second toe No obvious deformity or swelling Mild to no TTP at proximal phalanx Full strength and ROM at toes Negative compression test NVI distally  Bilateral Achille's No swelling or ecchymoses Full strength and ROM at ankle No TTP Neg Thompson's Negative squeeze test NVI  Brief Limited US Right 2nd toe: Visualized proximal phalanx in longitudinal and transverse plane.  Callous formation appreciated with no neovascularization. Much improved compared to prior visits.     Limited ultrasound bilateral Achilles: Appearance of bilateral Achilles tendons visualized in longitudinal and transverse planes.  Decrease in neovascularization compared to prior visits. Both Achille's measure 0.75cm 3 to 5 cm proximal to insertion site. At insertion daimeter now down to ~ 0.6 cms bilat.  Impression: Healing fracture and resolving achilles tendinosis  Ultrasound and interpretation by Dr. Kathrynn Speed and Wolfgang Phoenix. Fields, MD   Assessment and Plan:  1.  Closed nondisplaced fracture of proximal phalanx of lesser toe of right foot  Given clinical exam findings and reassuring ultrasound, patient can gradually return to full activity.  She can continue to use the steel shoe insert as needed.  Recommending continuing to ice the area daily.  Stressed the importance of a gradual return to full activity.  Follow-up as needed.  2.  Bilateral Achilles tendinosis  Overall it is hard to subjectively know how well she will tolerate exerciset given that she has been resting due to problem 1.  Objectively today, we find  no reason that she cannot return to full activity as long as her pain level allows.  She can continue to ice and focus on eccentric strengthening.  She will follow-up as needed.   Lanier Clam, DO, ATC Sports Medicine Fellow  I saw the patient with Dr. Kathrynn Speed and agree with assessment and plan.  Note reviewed and modified by me. Ila Mcgill, MD

## 2019-06-27 DIAGNOSIS — C49A2 Gastrointestinal stromal tumor of stomach: Secondary | ICD-10-CM | POA: Diagnosis not present

## 2019-06-28 DIAGNOSIS — Z08 Encounter for follow-up examination after completed treatment for malignant neoplasm: Secondary | ICD-10-CM | POA: Diagnosis not present

## 2019-06-28 DIAGNOSIS — Z8509 Personal history of malignant neoplasm of other digestive organs: Secondary | ICD-10-CM | POA: Diagnosis not present

## 2019-06-28 DIAGNOSIS — C49A2 Gastrointestinal stromal tumor of stomach: Secondary | ICD-10-CM | POA: Diagnosis not present

## 2019-06-30 DIAGNOSIS — Z8719 Personal history of other diseases of the digestive system: Secondary | ICD-10-CM | POA: Diagnosis not present

## 2019-06-30 DIAGNOSIS — Z8509 Personal history of malignant neoplasm of other digestive organs: Secondary | ICD-10-CM | POA: Diagnosis not present

## 2019-06-30 DIAGNOSIS — C49A2 Gastrointestinal stromal tumor of stomach: Secondary | ICD-10-CM | POA: Diagnosis not present

## 2019-06-30 DIAGNOSIS — Z08 Encounter for follow-up examination after completed treatment for malignant neoplasm: Secondary | ICD-10-CM | POA: Diagnosis not present

## 2019-07-25 DIAGNOSIS — Z13820 Encounter for screening for osteoporosis: Secondary | ICD-10-CM | POA: Diagnosis not present

## 2019-09-14 DIAGNOSIS — M810 Age-related osteoporosis without current pathological fracture: Secondary | ICD-10-CM | POA: Diagnosis not present

## 2019-09-14 DIAGNOSIS — Z6820 Body mass index (BMI) 20.0-20.9, adult: Secondary | ICD-10-CM | POA: Diagnosis not present

## 2019-09-14 DIAGNOSIS — C49A2 Gastrointestinal stromal tumor of stomach: Secondary | ICD-10-CM | POA: Diagnosis not present

## 2019-09-14 DIAGNOSIS — Z79899 Other long term (current) drug therapy: Secondary | ICD-10-CM | POA: Diagnosis not present

## 2019-10-05 ENCOUNTER — Other Ambulatory Visit (HOSPITAL_COMMUNITY): Payer: Self-pay | Admitting: *Deleted

## 2019-10-05 DIAGNOSIS — Z8509 Personal history of malignant neoplasm of other digestive organs: Secondary | ICD-10-CM | POA: Diagnosis not present

## 2019-10-05 DIAGNOSIS — Z08 Encounter for follow-up examination after completed treatment for malignant neoplasm: Secondary | ICD-10-CM | POA: Diagnosis not present

## 2019-10-05 DIAGNOSIS — K719 Toxic liver disease, unspecified: Secondary | ICD-10-CM | POA: Diagnosis not present

## 2019-10-05 DIAGNOSIS — C49A2 Gastrointestinal stromal tumor of stomach: Secondary | ICD-10-CM | POA: Diagnosis not present

## 2019-10-06 ENCOUNTER — Other Ambulatory Visit: Payer: Self-pay

## 2019-10-06 ENCOUNTER — Ambulatory Visit (HOSPITAL_COMMUNITY)
Admission: RE | Admit: 2019-10-06 | Discharge: 2019-10-06 | Disposition: A | Discharge status: Home / Self Care | Payer: Federal, State, Local not specified - PPO | Source: Ambulatory Visit | Attending: Internal Medicine | Admitting: Internal Medicine

## 2019-10-06 DIAGNOSIS — C49A2 Gastrointestinal stromal tumor of stomach: Secondary | ICD-10-CM | POA: Diagnosis not present

## 2019-10-06 DIAGNOSIS — Z8509 Personal history of malignant neoplasm of other digestive organs: Secondary | ICD-10-CM | POA: Diagnosis not present

## 2019-10-06 DIAGNOSIS — M810 Age-related osteoporosis without current pathological fracture: Secondary | ICD-10-CM | POA: Insufficient documentation

## 2019-10-06 DIAGNOSIS — K719 Toxic liver disease, unspecified: Secondary | ICD-10-CM | POA: Diagnosis not present

## 2019-10-06 DIAGNOSIS — Z08 Encounter for follow-up examination after completed treatment for malignant neoplasm: Secondary | ICD-10-CM | POA: Diagnosis not present

## 2019-10-06 MED ORDER — ZOLEDRONIC ACID 5 MG/100ML IV SOLN: 5.0000 mg | Freq: Once | INTRAVENOUS | Status: AC

## 2019-10-06 MED ORDER — ZOLEDRONIC ACID 5 MG/100ML IV SOLN
INTRAVENOUS | Status: AC
  Administered 2019-10-06: 5 mg via INTRAVENOUS
  Filled 2019-10-06: qty 100

## 2019-10-06 NOTE — Discharge Instructions (Signed)

## 2020-01-17 DIAGNOSIS — Z1231 Encounter for screening mammogram for malignant neoplasm of breast: Secondary | ICD-10-CM | POA: Diagnosis not present

## 2020-01-17 DIAGNOSIS — Z01419 Encounter for gynecological examination (general) (routine) without abnormal findings: Secondary | ICD-10-CM | POA: Diagnosis not present

## 2020-01-17 DIAGNOSIS — R6882 Decreased libido: Secondary | ICD-10-CM | POA: Diagnosis not present

## 2020-02-07 DIAGNOSIS — Z8509 Personal history of malignant neoplasm of other digestive organs: Secondary | ICD-10-CM | POA: Diagnosis not present

## 2020-02-07 DIAGNOSIS — Z08 Encounter for follow-up examination after completed treatment for malignant neoplasm: Secondary | ICD-10-CM | POA: Diagnosis not present

## 2020-02-07 DIAGNOSIS — C49A2 Gastrointestinal stromal tumor of stomach: Secondary | ICD-10-CM | POA: Diagnosis not present

## 2020-02-07 DIAGNOSIS — K719 Toxic liver disease, unspecified: Secondary | ICD-10-CM | POA: Diagnosis not present

## 2020-02-08 DIAGNOSIS — Z08 Encounter for follow-up examination after completed treatment for malignant neoplasm: Secondary | ICD-10-CM | POA: Diagnosis not present

## 2020-02-08 DIAGNOSIS — K719 Toxic liver disease, unspecified: Secondary | ICD-10-CM | POA: Diagnosis not present

## 2020-02-08 DIAGNOSIS — C49A2 Gastrointestinal stromal tumor of stomach: Secondary | ICD-10-CM | POA: Diagnosis not present

## 2020-02-08 DIAGNOSIS — Z8509 Personal history of malignant neoplasm of other digestive organs: Secondary | ICD-10-CM | POA: Diagnosis not present

## 2020-02-09 DIAGNOSIS — Z8509 Personal history of malignant neoplasm of other digestive organs: Secondary | ICD-10-CM | POA: Diagnosis not present

## 2020-02-09 DIAGNOSIS — C49A2 Gastrointestinal stromal tumor of stomach: Secondary | ICD-10-CM | POA: Diagnosis not present

## 2020-02-09 DIAGNOSIS — K719 Toxic liver disease, unspecified: Secondary | ICD-10-CM | POA: Diagnosis not present

## 2020-02-09 DIAGNOSIS — Z08 Encounter for follow-up examination after completed treatment for malignant neoplasm: Secondary | ICD-10-CM | POA: Diagnosis not present

## 2020-02-12 DIAGNOSIS — N951 Menopausal and female climacteric states: Secondary | ICD-10-CM | POA: Diagnosis not present

## 2020-02-12 DIAGNOSIS — R6882 Decreased libido: Secondary | ICD-10-CM | POA: Diagnosis not present

## 2020-02-27 DIAGNOSIS — M79642 Pain in left hand: Secondary | ICD-10-CM | POA: Diagnosis not present

## 2020-02-27 DIAGNOSIS — D1801 Hemangioma of skin and subcutaneous tissue: Secondary | ICD-10-CM | POA: Diagnosis not present

## 2020-02-27 DIAGNOSIS — D2261 Melanocytic nevi of right upper limb, including shoulder: Secondary | ICD-10-CM | POA: Diagnosis not present

## 2020-02-27 DIAGNOSIS — L814 Other melanin hyperpigmentation: Secondary | ICD-10-CM | POA: Diagnosis not present

## 2020-02-27 DIAGNOSIS — L820 Inflamed seborrheic keratosis: Secondary | ICD-10-CM | POA: Diagnosis not present

## 2020-02-27 DIAGNOSIS — L821 Other seborrheic keratosis: Secondary | ICD-10-CM | POA: Diagnosis not present

## 2020-03-06 ENCOUNTER — Ambulatory Visit: Payer: Federal, State, Local not specified - PPO | Admitting: Family Medicine

## 2020-03-13 DIAGNOSIS — N951 Menopausal and female climacteric states: Secondary | ICD-10-CM | POA: Diagnosis not present

## 2020-03-15 DIAGNOSIS — E039 Hypothyroidism, unspecified: Secondary | ICD-10-CM | POA: Diagnosis not present

## 2020-03-15 DIAGNOSIS — M810 Age-related osteoporosis without current pathological fracture: Secondary | ICD-10-CM | POA: Diagnosis not present

## 2020-03-15 DIAGNOSIS — E785 Hyperlipidemia, unspecified: Secondary | ICD-10-CM | POA: Diagnosis not present

## 2020-03-22 DIAGNOSIS — R82998 Other abnormal findings in urine: Secondary | ICD-10-CM | POA: Diagnosis not present

## 2020-03-22 DIAGNOSIS — Z1331 Encounter for screening for depression: Secondary | ICD-10-CM | POA: Diagnosis not present

## 2020-03-22 DIAGNOSIS — E785 Hyperlipidemia, unspecified: Secondary | ICD-10-CM | POA: Diagnosis not present

## 2020-03-22 DIAGNOSIS — Z23 Encounter for immunization: Secondary | ICD-10-CM | POA: Diagnosis not present

## 2020-03-22 DIAGNOSIS — Z0000 Encounter for general adult medical examination without abnormal findings: Secondary | ICD-10-CM | POA: Diagnosis not present

## 2020-04-22 DIAGNOSIS — R6882 Decreased libido: Secondary | ICD-10-CM | POA: Diagnosis not present

## 2020-04-22 DIAGNOSIS — N951 Menopausal and female climacteric states: Secondary | ICD-10-CM | POA: Diagnosis not present

## 2020-04-23 ENCOUNTER — Ambulatory Visit: Payer: Federal, State, Local not specified - PPO | Admitting: Sports Medicine

## 2020-04-23 DIAGNOSIS — Z1212 Encounter for screening for malignant neoplasm of rectum: Secondary | ICD-10-CM | POA: Diagnosis not present

## 2020-05-02 ENCOUNTER — Ambulatory Visit: Payer: Federal, State, Local not specified - PPO | Admitting: Sports Medicine

## 2020-05-02 ENCOUNTER — Other Ambulatory Visit: Payer: Self-pay

## 2020-05-02 DIAGNOSIS — M21611 Bunion of right foot: Secondary | ICD-10-CM | POA: Diagnosis not present

## 2020-05-02 DIAGNOSIS — M7741 Metatarsalgia, right foot: Secondary | ICD-10-CM

## 2020-05-02 NOTE — Progress Notes (Signed)
PCP: Marton Redwood, MD  Subjective:   HPI: Patient is a 59 y.o. female here for pain in her right third toe as well as a bunion on her right foot.  Right third toe pain Patient reports she was having pain in her right third toe for approximately 6 months.  She started buddy taping the toe because she noticed that it was curling under and the pain improved and even resolved.  She discontinued the buddy taping and the pain returned approximately 2 days later.  Denies any swelling, numbness, tingling, weakness.  Reports that she mainly notices the pain when she is walking.  She has difficulty determining exactly where the pain is. She walks for exercise up to 2 miles when bunion becomes painful  Bunion on right foot Patient reports that she is noticed more swelling and erythema in the anterior medial surface of her right foot.  She has been told she has a bunion in the past but does not want surgery at this time.  Past Medical History:  Diagnosis Date  . Abnormal Pap smear    H/O  . History of gastrointestinal stromal tumor (GIST)   . Hypothyroidism   . Thyroid disease    hypothyroid    Current Outpatient Medications on File Prior to Visit  Medication Sig Dispense Refill  . CALCIUM PO Take 1,500 mg by mouth daily.     . Cholecalciferol (VITAMIN D PO) Take 2,000 Units by mouth daily.     . Multiple Vitamin (MULTIVITAMIN WITH MINERALS) TABS tablet Take 1 tablet daily by mouth.    . nitroGLYCERIN (NITRODUR - DOSED IN MG/24 HR) 0.2 mg/hr patch UNWRAP AND APPLY 1/4 PATCH TO EACH ANKLE DAILY.(1/2 PATCH TOTAL DAILY) 45 patch 1  . SYNTHROID 75 MCG tablet Take 75 mcg daily before breakfast by mouth.     . zaleplon (SONATA) 10 MG capsule Take 10 mg at bedtime as needed by mouth for sleep.     No current facility-administered medications on file prior to visit.    Past Surgical History:  Procedure Laterality Date  . CERVICAL BIOPSY  W/ LOOP ELECTRODE EXCISION  2003  . cold knife conization   3/12  . EUS N/A 04/08/2017   Procedure: UPPER ENDOSCOPIC ULTRASOUND (EUS) LINEAR;  Surgeon: Milus Banister, MD;  Location: [facility];  Service: Endoscopy;  Laterality: N/A;  . LAPAROSCOPIC GASTRIC RESECTION N/A 06/01/2017   Procedure: LAPAROSCOPIC RESECTION GASTRIC OF GIST;  Surgeon: Excell Seltzer, MD;  Location: [facility];  Service: General;  Laterality: N/A;    Allergies  Allergen Reactions  . Imatinib Rash    Affected liver  . Fish Allergy     Stomach upset, diarrhea, ; SALMON  AND TUNA ; MOSTLY OILY FISHES     Social History   Socioeconomic History  . Marital status: Married    Spouse name: Not on file  . Number of children: Not on file  . Years of education: Not on file  . Highest education level: Not on file  Occupational History  . Not on file  Tobacco Use  . Smoking status: Never Smoker  . Smokeless tobacco: Never Used  Vaping Use  . Vaping Use: Never used  Substance and Sexual Activity  . Alcohol use: No  . Drug use: No  . Sexual activity: Yes    Partners: Male  Other Topics Concern  . Not on file  Social History Narrative  . Not on file   Social Determinants of Health  Financial Resource Strain: Not on file  Food Insecurity: Not on file  Transportation Needs: Not on file  Physical Activity: Not on file  Stress: Not on file  Social Connections: Not on file  Intimate Partner Violence: Not on file    Family History  Problem Relation Age of Onset  . Hypertension Mother   . Cancer Maternal Grandmother        breast  . Cancer Father        lung/melanoma  . Colon cancer Neg Hx   . Esophageal cancer Neg Hx   . Rectal cancer Neg Hx   . Stomach cancer Neg Hx     BP 126/78   Ht 5\' 6"  (1.676 m)   Wt 128 lb (58.1 kg)   BMI 20.66 kg/m   [facility] 05/02/2020  Frequency of aerobic exercise (# of days/week) 5  Average time in minutes 60  Frequency of strengthening activities (# of days/week) 3    No flowsheet data  found.  Review of Systems: See HPI above.     Objective:  Physical Exam:  Gen: NAD, comfortable in exam room  Right Ankle/Foot: No swelling, ecchymosis.  Erythema noted over right first MTP with bony deformity consistent with bunion. Mild valgus shift. Transverse arch shows drop of 2nd MT head.  The forefoot is widened significantly vs. The leftThis leads to displacement dorsally of the second toe.; Range of motion is full in all directions. Strength is 5/5 in all directions.  Patient with noted third toe medial deviation underneath the second toe with ambulation.  Tenderness to palpation at DIP of third digit.  Morton's callus noted. Long arch preserved  Left foot appears normal structurally.   Assessment & Plan:  Pain of the third digit of the right foot. Physical exam showed medial deviation of the third toe due to displacement of 2nd metatarsal due to drop of 2nd MTP joint through plantar plate.  (? If this is secondary to second digit fracture 1 year ago).  Valgus deviation of bunion likely also led to instability of 2nd MTP.Marland Kitchen  We discussed options for treatment including continuation of buddy tape versus insoles.  Metatarsal pads added to sports insoles with some relief of pain in the third digit. -Continue to use of the metatarsal pads in other shoes as tolerated, this should help with third digit pain as well as slow the formation of the bunion -May reinitiate buddy taping of the first 3 digits if needed -Follow-up as needed  I observed and examined the patient with the resident and agree with assessment and plan.  Note reviewed and modified by me. Ila Mcgill, MD

## 2020-05-02 NOTE — Assessment & Plan Note (Signed)
The valgus shift has increased somewhat and this is displacing the 2nd toe. Use sports insoles for now but may need custom orthotics if pain persists.

## 2020-05-02 NOTE — Assessment & Plan Note (Signed)
While she first noted this in 2010 patient has done well. Now with more transverse arch drop, we will add MT pads and for walking also use sports insoles.

## 2020-05-31 DIAGNOSIS — Z08 Encounter for follow-up examination after completed treatment for malignant neoplasm: Secondary | ICD-10-CM | POA: Diagnosis not present

## 2020-05-31 DIAGNOSIS — Z8509 Personal history of malignant neoplasm of other digestive organs: Secondary | ICD-10-CM | POA: Diagnosis not present

## 2020-05-31 DIAGNOSIS — C49A2 Gastrointestinal stromal tumor of stomach: Secondary | ICD-10-CM | POA: Diagnosis not present

## 2020-06-04 DIAGNOSIS — Z08 Encounter for follow-up examination after completed treatment for malignant neoplasm: Secondary | ICD-10-CM | POA: Diagnosis not present

## 2020-06-04 DIAGNOSIS — Z8509 Personal history of malignant neoplasm of other digestive organs: Secondary | ICD-10-CM | POA: Diagnosis not present

## 2020-06-04 DIAGNOSIS — C49A2 Gastrointestinal stromal tumor of stomach: Secondary | ICD-10-CM | POA: Diagnosis not present

## 2020-07-09 DIAGNOSIS — N951 Menopausal and female climacteric states: Secondary | ICD-10-CM | POA: Diagnosis not present

## 2020-09-17 DIAGNOSIS — Z79899 Other long term (current) drug therapy: Secondary | ICD-10-CM | POA: Diagnosis not present

## 2020-09-17 DIAGNOSIS — M810 Age-related osteoporosis without current pathological fracture: Secondary | ICD-10-CM | POA: Diagnosis not present

## 2020-10-09 ENCOUNTER — Other Ambulatory Visit (HOSPITAL_COMMUNITY): Payer: Self-pay | Admitting: *Deleted

## 2020-10-10 ENCOUNTER — Other Ambulatory Visit: Payer: Self-pay

## 2020-10-10 ENCOUNTER — Ambulatory Visit (HOSPITAL_COMMUNITY)
Admission: RE | Admit: 2020-10-10 | Discharge: 2020-10-10 | Disposition: A | Discharge status: Home / Self Care | Payer: Federal, State, Local not specified - PPO | Source: Ambulatory Visit | Attending: Internal Medicine | Admitting: Internal Medicine

## 2020-10-10 DIAGNOSIS — M810 Age-related osteoporosis without current pathological fracture: Secondary | ICD-10-CM | POA: Diagnosis not present

## 2020-10-10 MED ORDER — ZOLEDRONIC ACID 5 MG/100ML IV SOLN
INTRAVENOUS | Status: AC
  Filled 2020-10-10: qty 100

## 2020-10-10 MED ORDER — ZOLEDRONIC ACID 5 MG/100ML IV SOLN
5.0000 mg | Freq: Once | INTRAVENOUS | Status: AC
  Administered 2020-10-10: 5 mg via INTRAVENOUS

## 2020-10-16 DIAGNOSIS — H00012 Hordeolum externum right lower eyelid: Secondary | ICD-10-CM | POA: Diagnosis not present

## 2020-10-25 DIAGNOSIS — Z08 Encounter for follow-up examination after completed treatment for malignant neoplasm: Secondary | ICD-10-CM | POA: Diagnosis not present

## 2020-10-25 DIAGNOSIS — Z8509 Personal history of malignant neoplasm of other digestive organs: Secondary | ICD-10-CM | POA: Diagnosis not present

## 2020-10-25 DIAGNOSIS — C49A2 Gastrointestinal stromal tumor of stomach: Secondary | ICD-10-CM | POA: Diagnosis not present

## 2020-11-05 DIAGNOSIS — C49A2 Gastrointestinal stromal tumor of stomach: Secondary | ICD-10-CM | POA: Diagnosis not present

## 2020-11-05 DIAGNOSIS — Z8509 Personal history of malignant neoplasm of other digestive organs: Secondary | ICD-10-CM | POA: Diagnosis not present

## 2020-11-14 DIAGNOSIS — R6882 Decreased libido: Secondary | ICD-10-CM | POA: Diagnosis not present

## 2020-12-17 DIAGNOSIS — R6882 Decreased libido: Secondary | ICD-10-CM | POA: Diagnosis not present

## 2021-01-21 DIAGNOSIS — R6882 Decreased libido: Secondary | ICD-10-CM | POA: Diagnosis not present

## 2021-02-17 DIAGNOSIS — R6882 Decreased libido: Secondary | ICD-10-CM | POA: Diagnosis not present

## 2021-02-17 DIAGNOSIS — Z01419 Encounter for gynecological examination (general) (routine) without abnormal findings: Secondary | ICD-10-CM | POA: Diagnosis not present

## 2021-02-17 DIAGNOSIS — Z7689 Persons encountering health services in other specified circumstances: Secondary | ICD-10-CM | POA: Diagnosis not present

## 2021-02-17 DIAGNOSIS — Z1231 Encounter for screening mammogram for malignant neoplasm of breast: Secondary | ICD-10-CM | POA: Diagnosis not present

## 2021-03-11 DIAGNOSIS — D2261 Melanocytic nevi of right upper limb, including shoulder: Secondary | ICD-10-CM | POA: Diagnosis not present

## 2021-03-11 DIAGNOSIS — L821 Other seborrheic keratosis: Secondary | ICD-10-CM | POA: Diagnosis not present

## 2021-03-11 DIAGNOSIS — C44619 Basal cell carcinoma of skin of left upper limb, including shoulder: Secondary | ICD-10-CM | POA: Diagnosis not present

## 2021-03-11 DIAGNOSIS — L814 Other melanin hyperpigmentation: Secondary | ICD-10-CM | POA: Diagnosis not present

## 2021-03-11 DIAGNOSIS — D2272 Melanocytic nevi of left lower limb, including hip: Secondary | ICD-10-CM | POA: Diagnosis not present

## 2021-03-15 DIAGNOSIS — Z23 Encounter for immunization: Secondary | ICD-10-CM | POA: Diagnosis not present

## 2021-03-26 DIAGNOSIS — E039 Hypothyroidism, unspecified: Secondary | ICD-10-CM | POA: Diagnosis not present

## 2021-03-26 DIAGNOSIS — E785 Hyperlipidemia, unspecified: Secondary | ICD-10-CM | POA: Diagnosis not present

## 2021-03-26 DIAGNOSIS — M810 Age-related osteoporosis without current pathological fracture: Secondary | ICD-10-CM | POA: Diagnosis not present

## 2021-04-02 DIAGNOSIS — Z1331 Encounter for screening for depression: Secondary | ICD-10-CM | POA: Diagnosis not present

## 2021-04-02 DIAGNOSIS — Z0000 Encounter for general adult medical examination without abnormal findings: Secondary | ICD-10-CM | POA: Diagnosis not present

## 2021-04-02 DIAGNOSIS — R82998 Other abnormal findings in urine: Secondary | ICD-10-CM | POA: Diagnosis not present

## 2021-04-02 DIAGNOSIS — Z1339 Encounter for screening examination for other mental health and behavioral disorders: Secondary | ICD-10-CM | POA: Diagnosis not present

## 2021-04-02 DIAGNOSIS — E039 Hypothyroidism, unspecified: Secondary | ICD-10-CM | POA: Diagnosis not present

## 2021-05-02 DIAGNOSIS — C49A2 Gastrointestinal stromal tumor of stomach: Secondary | ICD-10-CM | POA: Diagnosis not present

## 2021-05-06 DIAGNOSIS — Z9049 Acquired absence of other specified parts of digestive tract: Secondary | ICD-10-CM | POA: Diagnosis not present

## 2021-05-06 DIAGNOSIS — C49A2 Gastrointestinal stromal tumor of stomach: Secondary | ICD-10-CM | POA: Diagnosis not present

## 2021-05-29 DIAGNOSIS — R6882 Decreased libido: Secondary | ICD-10-CM | POA: Diagnosis not present

## 2021-06-30 DIAGNOSIS — C44619 Basal cell carcinoma of skin of left upper limb, including shoulder: Secondary | ICD-10-CM | POA: Diagnosis not present

## 2021-08-06 DIAGNOSIS — Z13820 Encounter for screening for osteoporosis: Secondary | ICD-10-CM | POA: Diagnosis not present

## 2021-08-26 DIAGNOSIS — R6882 Decreased libido: Secondary | ICD-10-CM | POA: Diagnosis not present

## 2021-09-11 DIAGNOSIS — T781XXD Other adverse food reactions, not elsewhere classified, subsequent encounter: Secondary | ICD-10-CM | POA: Diagnosis not present

## 2021-09-17 DIAGNOSIS — Z79899 Other long term (current) drug therapy: Secondary | ICD-10-CM | POA: Diagnosis not present

## 2021-09-17 DIAGNOSIS — M810 Age-related osteoporosis without current pathological fracture: Secondary | ICD-10-CM | POA: Diagnosis not present

## 2021-09-25 ENCOUNTER — Telehealth: Payer: Self-pay | Admitting: Pharmacy Technician

## 2021-09-25 ENCOUNTER — Other Ambulatory Visit: Payer: Self-pay

## 2021-09-25 DIAGNOSIS — M810 Age-related osteoporosis without current pathological fracture: Secondary | ICD-10-CM | POA: Insufficient documentation

## 2021-09-25 NOTE — Telephone Encounter (Addendum)
Auth Submission: no auth needed ?Payer: [employer] ?Medication & CPT/J Code(s) submitted: Reclast (Zolendronic acid) J3489 ?Route of submission (phone, fax, portal): PHONE: [phone] ?Auth type: Buy/Bill ?Units/visits requested: X1 DOSE ?Reference number: [id number] 09/25/21 1:58 ?Approval from: 09/25/21 to 05/24/22  ?

## 2021-10-02 ENCOUNTER — Ambulatory Visit (INDEPENDENT_AMBULATORY_CARE_PROVIDER_SITE_OTHER): Payer: Federal, State, Local not specified - PPO

## 2021-10-02 VITALS — BP 123/58 | HR 56 | Temp 98.2°F | Resp 16 | Ht 66.0 in | Wt 126.8 lb

## 2021-10-02 DIAGNOSIS — M810 Age-related osteoporosis without current pathological fracture: Secondary | ICD-10-CM

## 2021-10-02 MED ORDER — SODIUM CHLORIDE 0.9 % IV SOLN: INTRAVENOUS | Status: DC

## 2021-10-02 MED ORDER — DIPHENHYDRAMINE HCL 25 MG PO CAPS: 25.0000 mg | ORAL_CAPSULE | Freq: Once | ORAL | Status: DC

## 2021-10-02 MED ORDER — ACETAMINOPHEN 325 MG PO TABS: 650.0000 mg | ORAL_TABLET | Freq: Once | ORAL | Status: DC

## 2021-10-02 MED ORDER — ZOLEDRONIC ACID 5 MG/100ML IV SOLN
5.0000 mg | Freq: Once | INTRAVENOUS | Status: AC
  Administered 2021-10-02: 5 mg via INTRAVENOUS
  Filled 2021-10-02: qty 100

## 2021-10-02 NOTE — Progress Notes (Signed)
Diagnosis: senile osteoprosis  ? ?Provider:  Marshell Garfinkel, MD ? ?Procedure: Infusion ? ?IV Type: Peripheral, IV Location: L Antecubital ? ?Reclast (Zolendronic Acid), Dose: 5 mg ? ?Infusion Start Time: 6811 ? ?Infusion Stop Time: 5726 ? ?Post Infusion IV Care: Peripheral IV Discontinued, Pt observation completed ? ?Discharge: Condition: Good, Destination: Home . AVS provided to patient.  ? ?Performed by:  Adelina Mings, LPN  ?  ?

## 2021-11-14 DIAGNOSIS — C49A2 Gastrointestinal stromal tumor of stomach: Secondary | ICD-10-CM | POA: Diagnosis not present

## 2021-11-18 DIAGNOSIS — C49A2 Gastrointestinal stromal tumor of stomach: Secondary | ICD-10-CM | POA: Diagnosis not present

## 2021-11-18 DIAGNOSIS — Z8509 Personal history of malignant neoplasm of other digestive organs: Secondary | ICD-10-CM | POA: Diagnosis not present

## 2021-11-18 DIAGNOSIS — Z08 Encounter for follow-up examination after completed treatment for malignant neoplasm: Secondary | ICD-10-CM | POA: Diagnosis not present

## 2021-11-18 DIAGNOSIS — Z9049 Acquired absence of other specified parts of digestive tract: Secondary | ICD-10-CM | POA: Diagnosis not present

## 2021-11-18 DIAGNOSIS — Z888 Allergy status to other drugs, medicaments and biological substances status: Secondary | ICD-10-CM | POA: Diagnosis not present

## 2021-11-27 DIAGNOSIS — R6882 Decreased libido: Secondary | ICD-10-CM | POA: Diagnosis not present

## 2021-12-19 DIAGNOSIS — R6882 Decreased libido: Secondary | ICD-10-CM | POA: Diagnosis not present

## 2022-02-02 ENCOUNTER — Ambulatory Visit: Payer: Self-pay | Admitting: Podiatry

## 2022-02-03 ENCOUNTER — Ambulatory Visit: Payer: Federal, State, Local not specified - PPO | Admitting: Sports Medicine

## 2022-02-03 VITALS — BP 126/70 | Wt 125.0 lb

## 2022-02-03 DIAGNOSIS — M7741 Metatarsalgia, right foot: Secondary | ICD-10-CM

## 2022-02-03 NOTE — Assessment & Plan Note (Signed)
Patient is to remain in hapad sport insole.  Added today small metatarsal pad on bilateral insoles.  She ambulated with insoles without any pain or difficulty.  Recommend ambulation with insoles at times of increased walking especially while on vacation.  She verbalized understanding.  We will follow-up when patient returns from vacation.

## 2022-02-03 NOTE — Progress Notes (Signed)
   Established Patient Office Visit  Subjective   Patient ID: Carrie Allison, female    DOB: 04-10-1961  Age: 61 y.o. MRN: [id number]  Right foot pain.  Carrie Allison presents today with chief complaint of right foot pain that has been nagging over the past few weeks.  She has been using a sport insole with no modifications which has given her some relief however she is headed on her trip to [place] later this week and was looking for further relief.  She typically ambulates and tennis shoes however they have a mostly soft sole.  Pain is mostly located on the bottom of her feet and described as a burning pain on the pads of her foot. She denies any numbness of tingling.    ROSAs listed above in HPI    Objective:     BP 126/70   Wt 125 lb (56.7 kg)   BMI 20.18 kg/m   Physical Exam Vitals reviewed.  Constitutional:      General: She is not in acute distress.    Appearance: Normal appearance. She is normal weight. She is not ill-appearing, toxic-appearing or diaphoretic.  Pulmonary:     Effort: Pulmonary effort is normal.  Neurological:     Mental Status: She is alert.   Right foot: Great toe hallux valgus deformity.  No ecchymosis or edema.  Large callus on the plantar surface.  Over the second and third metatarsal heads.  Slight loss of longitudinal arch with weightbearing.  Loss of horizontal arch leading to some splaying of her toes.  Full range of motion flexion extension supination and pronation of the ankle.  Full range of motion of the great toe.  No tenderness to palpation of the plantar fascia.  Negative calcaneal squeeze test.     Assessment & Plan:   Problem List Items Addressed This Visit       Other   Metatarsalgia of right foot - Primary    Patient is to remain in hapad sport insole.  Added today small metatarsal pad on bilateral insoles.  She ambulated with insoles without any pain or difficulty.  Recommend ambulation with insoles at times of increased walking  especially while on vacation.  She verbalized understanding.  We will follow-up when patient returns from vacation.       Return if symptoms worsen or fail to improve.    Elmore Guise, DO  I observed and examined the patient with the resident and agree with assessment and plan.  Note reviewed and modified by me. Ila Mcgill, MD

## 2022-03-11 DIAGNOSIS — Z85828 Personal history of other malignant neoplasm of skin: Secondary | ICD-10-CM | POA: Diagnosis not present

## 2022-03-11 DIAGNOSIS — L814 Other melanin hyperpigmentation: Secondary | ICD-10-CM | POA: Diagnosis not present

## 2022-03-11 DIAGNOSIS — L821 Other seborrheic keratosis: Secondary | ICD-10-CM | POA: Diagnosis not present

## 2022-03-11 DIAGNOSIS — D2261 Melanocytic nevi of right upper limb, including shoulder: Secondary | ICD-10-CM | POA: Diagnosis not present

## 2022-03-11 DIAGNOSIS — L820 Inflamed seborrheic keratosis: Secondary | ICD-10-CM | POA: Diagnosis not present

## 2022-03-14 DIAGNOSIS — Z23 Encounter for immunization: Secondary | ICD-10-CM | POA: Diagnosis not present

## 2022-03-16 DIAGNOSIS — R6882 Decreased libido: Secondary | ICD-10-CM | POA: Diagnosis not present

## 2022-03-16 DIAGNOSIS — Z01419 Encounter for gynecological examination (general) (routine) without abnormal findings: Secondary | ICD-10-CM | POA: Diagnosis not present

## 2022-03-16 DIAGNOSIS — Z1231 Encounter for screening mammogram for malignant neoplasm of breast: Secondary | ICD-10-CM | POA: Diagnosis not present

## 2022-03-16 DIAGNOSIS — Z124 Encounter for screening for malignant neoplasm of cervix: Secondary | ICD-10-CM | POA: Diagnosis not present

## 2022-04-07 ENCOUNTER — Ambulatory Visit: Payer: Federal, State, Local not specified - PPO | Admitting: Sports Medicine

## 2022-04-14 ENCOUNTER — Ambulatory Visit: Payer: Federal, State, Local not specified - PPO | Admitting: Sports Medicine

## 2022-04-14 VITALS — BP 110/60 | Ht 66.0 in | Wt 125.0 lb

## 2022-04-14 DIAGNOSIS — G8929 Other chronic pain: Secondary | ICD-10-CM | POA: Diagnosis not present

## 2022-04-14 DIAGNOSIS — M25512 Pain in left shoulder: Secondary | ICD-10-CM

## 2022-04-14 NOTE — Progress Notes (Signed)
  SUBJECTIVE:   CHIEF COMPLAINT / HPI:   Ms. Carrie Allison is a 61 yo presenting with left shoulder pain for 4 months. She noticed pain after yoga class. Since then she went on a trip to [place] and has been dealing with symptoms. Pain provoked with lifting left arm, like with the motion to put deodorant on. Naproxen has not helped and she avoid tylenol. Voltaren gel was also not helpful.  ROS no night pain/ no real weakness/ no neck pain  PERTINENT  PMH / PSH: N/A  Past Medical History:  Diagnosis Date   Abnormal Pap smear    H/O   History of gastrointestinal stromal tumor (GIST)    Hypothyroidism    Thyroid disease    hypothyroid    OBJECTIVE:  BP 110/60   Ht '5\' 6"'$  (1.676 m)   Wt 125 lb (56.7 kg)   BMI 20.18 kg/m  Constitutional: well-appearing  MSK:  No swelling noted to [facility] or GH joints, tenderness to [facility] joint, full active range of motion, no winging of scapula present.  Speeds negative Yergeson's negative Hawkins positive Neers positive  Left shoulder U/S: Mild arthritic changes to [facility] joint, inflammation present in biceps tendon sheath. No tears present in supraspinatus, infraspinatus, teres minor, or subscapularis  Impression: Left AC joint OA and mild supraspinatus tendinopathy  Ultrasound and interpretation by Wolfgang Phoenix. Fields, MD    ASSESSMENT/PLAN:   Left should pain due to [facility] arthritis versus tendinopathy- Nitro patch protocol, Rotator cuff home exercises given. Continue with activity modification, ice, and naproxen PRN.   [name] M. [name], D.O.  Internal Medicine Resident, PGY-2 [name]  Pager: [phone]

## 2022-04-14 NOTE — Assessment & Plan Note (Signed)
Use standard RC exercises NTG protocol Continue other activity as tolearted  Reck 6 weeks

## 2022-04-20 ENCOUNTER — Other Ambulatory Visit: Payer: Self-pay

## 2022-04-20 DIAGNOSIS — E785 Hyperlipidemia, unspecified: Secondary | ICD-10-CM | POA: Diagnosis not present

## 2022-04-20 DIAGNOSIS — E039 Hypothyroidism, unspecified: Secondary | ICD-10-CM | POA: Diagnosis not present

## 2022-04-20 DIAGNOSIS — M810 Age-related osteoporosis without current pathological fracture: Secondary | ICD-10-CM | POA: Diagnosis not present

## 2022-04-20 MED ORDER — NITROGLYCERIN 0.2 MG/HR TD PT24: MEDICATED_PATCH | TRANSDERMAL | 1 refills | Status: DC

## 2022-04-24 DIAGNOSIS — Z0000 Encounter for general adult medical examination without abnormal findings: Secondary | ICD-10-CM | POA: Diagnosis not present

## 2022-04-24 DIAGNOSIS — Z1331 Encounter for screening for depression: Secondary | ICD-10-CM | POA: Diagnosis not present

## 2022-04-24 DIAGNOSIS — C49A2 Gastrointestinal stromal tumor of stomach: Secondary | ICD-10-CM | POA: Diagnosis not present

## 2022-04-24 DIAGNOSIS — R82998 Other abnormal findings in urine: Secondary | ICD-10-CM | POA: Diagnosis not present

## 2022-04-24 DIAGNOSIS — Z1339 Encounter for screening examination for other mental health and behavioral disorders: Secondary | ICD-10-CM | POA: Diagnosis not present

## 2022-04-27 DIAGNOSIS — R6882 Decreased libido: Secondary | ICD-10-CM | POA: Diagnosis not present

## 2022-05-05 DIAGNOSIS — Z08 Encounter for follow-up examination after completed treatment for malignant neoplasm: Secondary | ICD-10-CM | POA: Diagnosis not present

## 2022-05-05 DIAGNOSIS — Z8509 Personal history of malignant neoplasm of other digestive organs: Secondary | ICD-10-CM | POA: Diagnosis not present

## 2022-05-05 DIAGNOSIS — C49A2 Gastrointestinal stromal tumor of stomach: Secondary | ICD-10-CM | POA: Diagnosis not present

## 2022-05-08 DIAGNOSIS — Z08 Encounter for follow-up examination after completed treatment for malignant neoplasm: Secondary | ICD-10-CM | POA: Diagnosis not present

## 2022-05-08 DIAGNOSIS — C49A2 Gastrointestinal stromal tumor of stomach: Secondary | ICD-10-CM | POA: Diagnosis not present

## 2022-05-08 DIAGNOSIS — Z8509 Personal history of malignant neoplasm of other digestive organs: Secondary | ICD-10-CM | POA: Diagnosis not present

## 2022-05-12 DIAGNOSIS — C49A2 Gastrointestinal stromal tumor of stomach: Secondary | ICD-10-CM | POA: Diagnosis not present

## 2022-05-26 ENCOUNTER — Ambulatory Visit: Payer: Federal, State, Local not specified - PPO | Admitting: Sports Medicine

## 2022-06-02 ENCOUNTER — Ambulatory Visit: Payer: Federal, State, Local not specified - PPO | Admitting: Sports Medicine

## 2022-06-02 VITALS — BP 120/70 | Ht 66.0 in | Wt 128.0 lb

## 2022-06-02 DIAGNOSIS — M7741 Metatarsalgia, right foot: Secondary | ICD-10-CM

## 2022-06-02 DIAGNOSIS — G8929 Other chronic pain: Secondary | ICD-10-CM

## 2022-06-02 DIAGNOSIS — M25512 Pain in left shoulder: Secondary | ICD-10-CM

## 2022-06-02 NOTE — Assessment & Plan Note (Signed)
I anticipate she will continue to have improvement of her shoulder pain with her rotator cuff exercises however we will add scapular exercises to her regimen at this time.  Will hold off on the 10 pound bench press.  Discussed with her her injury could take closer to 12 to 16 weeks for full healing however she should continue with her home therapy.  With regards to her nitroglycerin patch she was counseled to move the patch to a more lateral position close to the rotator cuff and away from the pectoralis muscle.  She verbalized understanding.  We will follow-up with her in 6 weeks.

## 2022-06-02 NOTE — Progress Notes (Signed)
   Established Patient Office Visit  Subjective   Patient ID: Carrie Allison, female    DOB: 1960/12/29  Age: 62 y.o. MRN: [id number]  Follow-up left shoulder pain.  Carrie Allison is here today for follow-up of her left shoulder pain.  She was diagnosed with supraspinatus tendinopathy and ultrasound revealed some swelling in the biceps tendon.  She has been performing her rotator cuff exercises regularly and has had quite a bit of improvement in her pain.  She still does get some catching and sharp pain with overhead motions and things such as reaching back to put on her jacket.  She did try to return to yoga classes however was unable to keep up with the pace of the class as she had to transition slowly to reduce some of the pain she was having.  She also tried to benchpress 10 pounds and had some discomfort in her shoulder and she was unable to return to a neutral position on the left side secondary to pain.  She has been doing her range of motion exercises with a 2.5 pound weight and is not having any discomfort with that.  She is been using nitro patches with very minimal side effects, occasional headaches that resolved quickly.   ROS as listed above in HPI    Objective:     BP 120/70   Ht '5\' 6"'$  (1.676 m)   Wt 128 lb (58.1 kg)   BMI 20.66 kg/m   Physical Exam Vitals reviewed.  Constitutional:      General: She is not in acute distress.    Appearance: Normal appearance. She is normal weight. She is not ill-appearing or diaphoretic.  Pulmonary:     Effort: Pulmonary effort is normal.  Neurological:     Mental Status: She is alert.   Left shoulder: No obvious deformity or asymmetry.  No tenderness to palpation over her AC joint.  She does have some very slight tenderness to her upper trapezius muscles.  Patient has good range of motion with forward flexion, abduction internal and external rotation.  Slightly uncomfortable arc.  Strength 5/5 internal and external rotation, abduction,  forward flexion and resisted bicep curl.  Negative empty can testing.  Grip strength 5/5 bilateral, radial pulse 2+     Assessment & Plan:   Problem List Items Addressed This Visit       Other   Chronic left shoulder pain - Primary    I anticipate she will continue to have improvement of her shoulder pain with her rotator cuff exercises however we will add scapular exercises to her regimen at this time.  Will hold off on the 10 pound bench press.  Discussed with her her injury could take closer to 12 to 16 weeks for full healing however she should continue with her home therapy.  With regards to her nitroglycerin patch she was counseled to move the patch to a more lateral position close to the rotator cuff and away from the pectoralis muscle.  She verbalized understanding.  We will follow-up with her in 6 weeks.       Return in about 6 weeks (around 07/14/2022).    Elmore Guise, DO  I observed and examined the patient with the [facility] resident and agree with assessment and plan.  Note reviewed and modified by me. Ila Mcgill, MD

## 2022-06-02 NOTE — Patient Instructions (Signed)
Continue with your rotator cuff exercises and range of motion exercises.  We will also add some scapular stabilization exercises to your regimen. Recommend moving your nitroglycerin patch more towards the outside of your shoulder.  We will follow-up with you in 6 weeks.

## 2022-06-17 DIAGNOSIS — R6882 Decreased libido: Secondary | ICD-10-CM | POA: Diagnosis not present

## 2022-07-14 ENCOUNTER — Ambulatory Visit: Payer: Federal, State, Local not specified - PPO | Admitting: Sports Medicine

## 2022-07-14 VITALS — BP 120/60 | Ht 66.0 in | Wt 125.0 lb

## 2022-07-14 DIAGNOSIS — G8929 Other chronic pain: Secondary | ICD-10-CM | POA: Diagnosis not present

## 2022-07-14 DIAGNOSIS — M25512 Pain in left shoulder: Secondary | ICD-10-CM | POA: Diagnosis not present

## 2022-07-14 NOTE — Progress Notes (Signed)
Complaint follow-up of left rotator cuff tendinopathy  Patient was first seen April 14, 2022 with supraspinatus tendinopathy and some mild AC joint arthritis Since that time she has been on home exercise program She has been using topical nitroglycerin patches By her last follow-up she had made significant progress On her visit today she says she is at least 80% better and her strength feels good She still has a little bit of limitation of motion on the wall climbs to full elevation She still has tightness on back scratch No significant pain at this time  Physical exam Thin white female in no acute distress BP 120/60   Ht 5' 6"$  (1.676 m)   Wt 125 lb (56.7 kg)   BMI 20.18 kg/m   Shoulder: Left Inspection reveals no abnormalities, atrophy or asymmetry. Palpation is normal with no tenderness over AC joint or bicipital groove. ROM is about 10 degrees less on full elevation and back scratch is about 4 to 5 inches lower on the left side Rotator cuff strength normal throughout. No signs of impingement with negative Neer and Hawkin's tests, empty can. Speeds and Yergason's tests normal. No labral pathology noted with negative Obrien's, negative clunk and good stability. Normal scapular function observed. No painful arc and no drop arm sign. No apprehension sign

## 2022-07-14 NOTE — Assessment & Plan Note (Signed)
She seems to be responding well to a rotator cuff home rehab program along with the use of nitroglycerin patches  Both on exam and based on her history she is 80 to 90% better  She will continue this regimen for at least the next month If she still has any symptoms she can return for recheck Otherwise she can progress to full activities using her left shoulder and arm as tolerated

## 2022-07-23 DIAGNOSIS — R6882 Decreased libido: Secondary | ICD-10-CM | POA: Diagnosis not present

## 2022-08-25 ENCOUNTER — Ambulatory Visit: Payer: Federal, State, Local not specified - PPO | Admitting: Sports Medicine

## 2022-09-01 ENCOUNTER — Ambulatory Visit: Payer: Federal, State, Local not specified - PPO | Admitting: Sports Medicine

## 2022-09-01 VITALS — BP 104/60 | Ht 66.0 in | Wt 126.0 lb

## 2022-09-01 DIAGNOSIS — G8929 Other chronic pain: Secondary | ICD-10-CM | POA: Diagnosis not present

## 2022-09-01 DIAGNOSIS — M25512 Pain in left shoulder: Secondary | ICD-10-CM | POA: Diagnosis not present

## 2022-09-01 DIAGNOSIS — M7741 Metatarsalgia, right foot: Secondary | ICD-10-CM | POA: Diagnosis not present

## 2022-09-01 NOTE — Assessment & Plan Note (Signed)
Add MT pads for walking and regular shoes for RT foot.

## 2022-09-01 NOTE — Progress Notes (Signed)
PCP: Cleatis Polka., MD  Subjective:   HPI: Patient is a 62 y.o. female here for follow up of left shoulder pain. Started about 4-5 months ago that started after a strenuous session of yoga. Has been using the nitroglycerin patches that have been helping. Also doing strength and flexibility exercises that she was recommended last time which is helping as well. Today she reports that the pain is significantly better. Feels that if she dose not further improve from this then she can still maintain a good quality of life. The pain is sharp intermittently and comes and goes that occurs once in awhile with certain movements. Denies radiating pain. Denies associated numbness or tingling. She has noticed that her strength has improved. Flexibility is better as well.     Past Medical History:  Diagnosis Date   Abnormal Pap smear    H/O   History of gastrointestinal stromal tumor (GIST)    Hypothyroidism    Thyroid disease    hypothyroid    Current Outpatient Medications on File Prior to Visit  Medication Sig Dispense Refill   CALCIUM PO Take 1,500 mg by mouth daily.      Cholecalciferol (VITAMIN D PO) Take 2,000 Units by mouth daily.      Multiple Vitamin (MULTIVITAMIN WITH MINERALS) TABS tablet Take 1 tablet daily by mouth.     nitroGLYCERIN (NITRODUR - DOSED IN MG/24 HR) 0.2 mg/hr patch UNWRAP AND APPLY 1/4 PATCH TO EACH ANKLE DAILY.(1/2 PATCH TOTAL DAILY) 45 patch 1   SYNTHROID 75 MCG tablet Take 75 mcg daily before breakfast by mouth.      zaleplon (SONATA) 10 MG capsule Take 10 mg at bedtime as needed by mouth for sleep.     No current facility-administered medications on file prior to visit.    Past Surgical History:  Procedure Laterality Date   CERVICAL BIOPSY  W/ LOOP ELECTRODE EXCISION  2003   cold knife conization  3/12   EUS N/A 04/08/2017   Procedure: UPPER ENDOSCOPIC ULTRASOUND (EUS) LINEAR;  Surgeon: Rachael Fee, MD;  Location: [facility];  Service: Endoscopy;   Laterality: N/A;   LAPAROSCOPIC GASTRIC RESECTION N/A 06/01/2017   Procedure: LAPAROSCOPIC RESECTION GASTRIC OF GIST;  Surgeon: Glenna Fellows, MD;  Location: [facility];  Service: General;  Laterality: N/A;    Allergies  Allergen Reactions   Imatinib Rash    Affected liver   Fish Allergy     Stomach upset, diarrhea, ; SALMON  AND TUNA ; MOSTLY OILY FISHES     BP 104/60   Ht 5\' 6"  (1.676 m)   Wt 126 lb (57.2 kg)   BMI 20.34 kg/m      05/02/2020    2:48 PM  [facility] Exercise  Frequency of aerobic exercise (# of days/week) 5  Average time in minutes 60  Frequency of strengthening activities (# of days/week) 3        No data to display              Objective:  Physical Exam:  Gen: NAD, comfortable in exam room MSK: Left shoulder Inspection: no gross deformity, no associated edema or erythema noted Palpation: no point tenderness along AC joint ROM: mild crepitus noted with active ROM, mildly limited active abduction with flexion, extension and adduction intact  Strength: CN 11 grossly intact, 5/5 UE strength bilaterally, rotator cuff strength fully intact, gross sensation intact Special testing: negative Yergason's and Speed's testing, negative Neer's, negative empty can  testing, no painful arc   Assessment & Plan:  1. Left shoulder pain: secondary to rotator cuff etiology with likely component of osteoarthritis of the [facility] as well. Significantly improved both from patient's perspective and today on exam. Continue rotator cuff rehab and discussed other rehab exercises today to further improve. Follow up in 3 months to monitor progression.      I observed and examined the patient with the resident and agree with assessment and plan.  Note reviewed and modified by me. Sterling Big, MD  Note has metatarsalgia on RT foot and I tried a pad in regular shoes today which seemed to help.  She has sports insoles for workout shoes with MT pad on RT. KBF

## 2022-09-01 NOTE — Assessment & Plan Note (Signed)
This has progressed to 90% of function of RT shoulder per the patient. We are continuing to maximize ROM and maintain RC strength. Check in 3 to 4 months

## 2022-09-07 DIAGNOSIS — R6882 Decreased libido: Secondary | ICD-10-CM | POA: Diagnosis not present

## 2022-09-23 DIAGNOSIS — Z79899 Other long term (current) drug therapy: Secondary | ICD-10-CM | POA: Diagnosis not present

## 2022-09-23 DIAGNOSIS — M810 Age-related osteoporosis without current pathological fracture: Secondary | ICD-10-CM | POA: Diagnosis not present

## 2022-09-24 ENCOUNTER — Telehealth: Payer: Self-pay | Admitting: Pharmacy Technician

## 2022-09-24 ENCOUNTER — Other Ambulatory Visit: Payer: Self-pay

## 2022-09-24 NOTE — Telephone Encounter (Signed)
Auth Submission: NO AUTH NEEDED Site of care: Site of care: [facility] Payer: BCBS Medication & CPT/J Code(s) submitted: Reclast (Zolendronic acid) [id number] Route of submission (phone, fax, portal):  Phone # Fax # Auth type: Buy/Bill Units/visits requested: 1 Reference number:  Approval from: 09/24/22 to 05/25/23

## 2022-09-29 ENCOUNTER — Ambulatory Visit (INDEPENDENT_AMBULATORY_CARE_PROVIDER_SITE_OTHER): Payer: Federal, State, Local not specified - PPO

## 2022-09-29 VITALS — BP 105/66 | HR 57 | Temp 98.3°F | Resp 16 | Ht 66.0 in | Wt 127.0 lb

## 2022-09-29 DIAGNOSIS — M810 Age-related osteoporosis without current pathological fracture: Secondary | ICD-10-CM | POA: Diagnosis not present

## 2022-09-29 MED ORDER — ACETAMINOPHEN 325 MG PO TABS: 650.0000 mg | ORAL_TABLET | Freq: Once | ORAL | Status: DC

## 2022-09-29 MED ORDER — DIPHENHYDRAMINE HCL 25 MG PO CAPS: 25.0000 mg | ORAL_CAPSULE | Freq: Once | ORAL | Status: DC

## 2022-09-29 MED ORDER — ZOLEDRONIC ACID 5 MG/100ML IV SOLN
5.0000 mg | Freq: Once | INTRAVENOUS | Status: AC
  Administered 2022-09-29: 5 mg via INTRAVENOUS
  Filled 2022-09-29: qty 100

## 2022-09-29 MED ORDER — SODIUM CHLORIDE 0.9 % IV SOLN: INTRAVENOUS | Status: DC

## 2022-09-29 NOTE — Progress Notes (Signed)
Diagnosis: Osteoporosis  Provider:  Chilton Greathouse MD  Procedure: IV Infusion  IV Type: Peripheral, IV Location: L Forearm  Reclast (Zolendronic Acid), Dose: 5 mg  Infusion Start Time: 1221  Infusion Stop Time: 1249  Post Infusion IV Care: Peripheral IV Discontinued   Observation declined   Discharge: Condition: Good, Destination: Home . AVS Declined  Performed by:  Marlow Baars [place]Burchett, RN

## 2022-10-27 DIAGNOSIS — R6882 Decreased libido: Secondary | ICD-10-CM | POA: Diagnosis not present

## 2022-11-11 DIAGNOSIS — Z08 Encounter for follow-up examination after completed treatment for malignant neoplasm: Secondary | ICD-10-CM | POA: Diagnosis not present

## 2022-11-11 DIAGNOSIS — Z8509 Personal history of malignant neoplasm of other digestive organs: Secondary | ICD-10-CM | POA: Diagnosis not present

## 2022-11-11 DIAGNOSIS — C49A2 Gastrointestinal stromal tumor of stomach: Secondary | ICD-10-CM | POA: Diagnosis not present

## 2022-11-17 DIAGNOSIS — C49A2 Gastrointestinal stromal tumor of stomach: Secondary | ICD-10-CM | POA: Diagnosis not present

## 2022-11-17 DIAGNOSIS — Z483 Aftercare following surgery for neoplasm: Secondary | ICD-10-CM | POA: Diagnosis not present

## 2022-11-17 DIAGNOSIS — Z903 Acquired absence of stomach [part of]: Secondary | ICD-10-CM | POA: Diagnosis not present

## 2022-11-17 DIAGNOSIS — K7689 Other specified diseases of liver: Secondary | ICD-10-CM | POA: Diagnosis not present

## 2022-12-01 DIAGNOSIS — C49A2 Gastrointestinal stromal tumor of stomach: Secondary | ICD-10-CM | POA: Diagnosis not present

## 2022-12-01 DIAGNOSIS — Z08 Encounter for follow-up examination after completed treatment for malignant neoplasm: Secondary | ICD-10-CM | POA: Diagnosis not present

## 2022-12-08 DIAGNOSIS — R6882 Decreased libido: Secondary | ICD-10-CM | POA: Diagnosis not present

## 2022-12-31 ENCOUNTER — Encounter: Payer: Self-pay | Admitting: Pulmonary Disease

## 2023-01-11 ENCOUNTER — Ambulatory Visit: Payer: Federal, State, Local not specified - PPO | Admitting: Podiatry

## 2023-01-11 ENCOUNTER — Encounter: Payer: Self-pay | Admitting: Podiatry

## 2023-01-11 ENCOUNTER — Ambulatory Visit (INDEPENDENT_AMBULATORY_CARE_PROVIDER_SITE_OTHER): Payer: Federal, State, Local not specified - PPO

## 2023-01-11 DIAGNOSIS — M2041 Other hammer toe(s) (acquired), right foot: Secondary | ICD-10-CM | POA: Diagnosis not present

## 2023-01-11 NOTE — Progress Notes (Signed)
Chief Complaint  Patient presents with   Myrtie Neither    RM8: New patient here for hammer toe    HPI: 62 y.o. female presenting today as a new patient for evaluation of a symptomatic hammertoe to the third digit of the right foot.  Patient is retired and she says that she is very with exercising and sports.  She believes that she overdid it when she began to experience pain and tenderness associated to a hammertoe to the third digit of the right foot.  She has reduced her activity over the last week and it has improved slightly but she continues to have some pain and tenderness.  Past Medical History:  Diagnosis Date   Abnormal Pap smear    H/O   History of gastrointestinal stromal tumor (GIST)    Hypothyroidism    Thyroid disease    hypothyroid   Past Surgical History:  Procedure Laterality Date   CERVICAL BIOPSY  W/ LOOP ELECTRODE EXCISION  2003   cold knife conization  3/12   EUS N/A 04/08/2017   Procedure: UPPER ENDOSCOPIC ULTRASOUND (EUS) LINEAR;  Surgeon: Rachael Fee, MD;  Location: [facility];  Service: Endoscopy;  Laterality: N/A;   LAPAROSCOPIC GASTRIC RESECTION N/A 06/01/2017   Procedure: LAPAROSCOPIC RESECTION GASTRIC OF GIST;  Surgeon: Glenna Fellows, MD;  Location: [facility];  Service: General;  Laterality: N/A;   Allergies  Allergen Reactions   Imatinib Rash    Affected liver   Fish Allergy     Stomach upset, diarrhea, ; SALMON  AND TUNA ; MOSTLY OILY FISHES       Objective: Physical Exam General: The patient is alert and oriented x3 in no acute distress.  Dermatology: Skin is cool, dry and supple bilateral lower extremities. Negative for open lesions or macerations.  Vascular: Palpable pedal pulses bilaterally. No edema or erythema noted. Capillary refill within normal limits.  Neurological: Grossly intact via light touch  Musculoskeletal Exam: Adductovarus hammertoe contracture noted to the third digit of the right foot with associated tenderness  with palpation.  Radiographic Exam RT foot 01/11/2023: Malalignment of the distal phalanx of the toe of the third digit of the right foot with contracture at the DIPJ   Assessment: 1.  Adductovarus hammertoe right third toe with contracture at the DIPJ   Plan of Care:  -Patient evaluated.  X-rays reviewed -Today we discussed both conservative and surgical management for the symptomatic hammertoe to the right third toe.  Conservatively recommend silicone toe crest pads to offload pressure from the distal tip of the hammertoe.  Also recommend silicone toe caps to use as needed -Recommend good supportive tennis shoes that allow plenty of room in the toebox area -Surgically we did discuss DIPJ arthroplasty with percutaneous pin fixation to better align the toe.  The postoperative recovery course was also explained in detail explaining that she will have a percutaneous fixation pin in the toe for approximately 4-6 weeks postoperatively.  At that time she will not be able to be very active and will need to reduce her activity.  She will be in a surgical shoe. -Patient would like to think about surgery and better plan for a time in her life when she can reduce her activity and allow for her postoperative recovery -Return to clinic as needed  *Very active    Felecia Shelling, DPM [facility]  Dr. Felecia Shelling, DPM    [address]  [place], [place] [address]                Office [phone]  Fax [phone]

## 2023-01-27 DIAGNOSIS — R6882 Decreased libido: Secondary | ICD-10-CM | POA: Diagnosis not present

## 2023-02-24 ENCOUNTER — Ambulatory Visit: Payer: Federal, State, Local not specified - PPO | Admitting: Podiatry

## 2023-03-10 DIAGNOSIS — R6882 Decreased libido: Secondary | ICD-10-CM | POA: Diagnosis not present

## 2023-03-17 ENCOUNTER — Encounter: Payer: Self-pay | Admitting: Podiatry

## 2023-03-17 ENCOUNTER — Ambulatory Visit: Payer: Federal, State, Local not specified - PPO | Admitting: Podiatry

## 2023-03-17 DIAGNOSIS — M7742 Metatarsalgia, left foot: Secondary | ICD-10-CM | POA: Diagnosis not present

## 2023-03-17 DIAGNOSIS — M7741 Metatarsalgia, right foot: Secondary | ICD-10-CM

## 2023-03-17 NOTE — Progress Notes (Signed)
   Chief Complaint  Patient presents with   Foot Orthotics    Interested in maybe some long term inserts, got some insoles from a sports med doc and he placed a met pad on the right and wonders if she needs it on the left too since she has been getting an intermittent pain plantar forefoot left, only really hurts after pickle ball, any recommendations on stretching exercises    HPI: 62 y.o. female presenting today for follow-up evaluation of bilateral generalized foot pain.  Patient wears a pair of OTC arch support with metatarsal pads which help minimally.  She believes that possibly some custom orthotics would help significantly.  She does have a long history of chronic metatarsalgia to the bilateral feet.  Past Medical History:  Diagnosis Date   Abnormal Pap smear    H/O   History of gastrointestinal stromal tumor (GIST)    Hypothyroidism    Thyroid disease    hypothyroid   Past Surgical History:  Procedure Laterality Date   CERVICAL BIOPSY  W/ LOOP ELECTRODE EXCISION  2003   cold knife conization  3/12   EUS N/A 04/08/2017   Procedure: UPPER ENDOSCOPIC ULTRASOUND (EUS) LINEAR;  Surgeon: Rachael Fee, MD;  Location: [facility];  Service: Endoscopy;  Laterality: N/A;   LAPAROSCOPIC GASTRIC RESECTION N/A 06/01/2017   Procedure: LAPAROSCOPIC RESECTION GASTRIC OF GIST;  Surgeon: Glenna Fellows, MD;  Location: [facility];  Service: General;  Laterality: N/A;   Allergies  Allergen Reactions   Imatinib Rash    Affected liver   Fish Allergy     Stomach upset, diarrhea, ; SALMON  AND TUNA ; MOSTLY OILY FISHES       Objective: Physical Exam General: The patient is alert and oriented x3 in no acute distress.  Dermatology: Skin is cool, dry and supple bilateral lower extremities. Negative for open lesions or macerations.  Vascular: Palpable pedal pulses bilaterally. No edema or erythema noted. Capillary refill within normal limits.  Neurological: Grossly intact via light  touch  Musculoskeletal Exam: Adductovarus hammertoe contracture noted to the third digit of the right foot with associated tenderness with palpation.  There are some tenderness with palpation along the bilateral forefoot especially the sub-2nd and 3rd MTP joints bilateral.  Radiographic Exam RT foot 01/11/2023: Malalignment of the distal phalanx of the toe of the third digit of the right foot with contracture at the DIPJ   Assessment: 1.  Adductovarus hammertoe right third toe with contracture at the DIPJ   Plan of Care:  -Patient evaluated.  -Continue conservative treatment and therapy -Continue wearing good supportive shoes and sneakers -Appointment with orthotics department for custom molded orthotics with offloading metatarsal pads bilateral.  This should help support the medial longitudinal arch of the foot and offload pressure from the forefoot -Return to clinic with me as needed  *Very active    Felecia Shelling, DPM [facility]  Dr. Felecia Shelling, DPM    [address] [place], [place] [address]                Office [phone]  Fax [phone]

## 2023-03-20 DIAGNOSIS — Z23 Encounter for immunization: Secondary | ICD-10-CM | POA: Diagnosis not present

## 2023-03-22 DIAGNOSIS — Z23 Encounter for immunization: Secondary | ICD-10-CM | POA: Diagnosis not present

## 2023-03-31 DIAGNOSIS — Z01419 Encounter for gynecological examination (general) (routine) without abnormal findings: Secondary | ICD-10-CM | POA: Diagnosis not present

## 2023-03-31 DIAGNOSIS — Z124 Encounter for screening for malignant neoplasm of cervix: Secondary | ICD-10-CM | POA: Diagnosis not present

## 2023-03-31 DIAGNOSIS — Z6821 Body mass index (BMI) 21.0-21.9, adult: Secondary | ICD-10-CM | POA: Diagnosis not present

## 2023-03-31 DIAGNOSIS — Z1231 Encounter for screening mammogram for malignant neoplasm of breast: Secondary | ICD-10-CM | POA: Diagnosis not present

## 2023-04-01 ENCOUNTER — Ambulatory Visit (INDEPENDENT_AMBULATORY_CARE_PROVIDER_SITE_OTHER): Payer: Federal, State, Local not specified - PPO | Admitting: Family Medicine

## 2023-04-01 VITALS — BP 114/63 | Ht 66.0 in | Wt 130.0 lb

## 2023-04-01 DIAGNOSIS — M7741 Metatarsalgia, right foot: Secondary | ICD-10-CM

## 2023-04-01 NOTE — Progress Notes (Deleted)
PCP: Cleatis Polka., MD  Subjective:   HPI: Patient is a 62 y.o. female here for orthotics.  Has a history of metatarsalgia and use of small metatarsal pads on bilateral insoles.  Past Medical History:  Diagnosis Date   Abnormal Pap smear    H/O   History of gastrointestinal stromal tumor (GIST)    Hypothyroidism    Thyroid disease    hypothyroid    Current Outpatient Medications on File Prior to Visit  Medication Sig Dispense Refill   CALCIUM PO Take 1,500 mg by mouth daily.      Cholecalciferol (VITAMIN D PO) Take 2,000 Units by mouth daily.      Multiple Vitamin (MULTIVITAMIN WITH MINERALS) TABS tablet Take 1 tablet daily by mouth.     SYNTHROID 75 MCG tablet Take 75 mcg daily before breakfast by mouth.      zaleplon (SONATA) 10 MG capsule Take 10 mg at bedtime as needed by mouth for sleep.     No current facility-administered medications on file prior to visit.    Past Surgical History:  Procedure Laterality Date   CERVICAL BIOPSY  W/ LOOP ELECTRODE EXCISION  2003   cold knife conization  3/12   EUS N/A 04/08/2017   Procedure: UPPER ENDOSCOPIC ULTRASOUND (EUS) LINEAR;  Surgeon: Rachael Fee, MD;  Location: [facility];  Service: Endoscopy;  Laterality: N/A;   LAPAROSCOPIC GASTRIC RESECTION N/A 06/01/2017   Procedure: LAPAROSCOPIC RESECTION GASTRIC OF GIST;  Surgeon: Glenna Fellows, MD;  Location: [facility];  Service: General;  Laterality: N/A;    Allergies  Allergen Reactions   Imatinib Rash    Affected liver   Fish Allergy     Stomach upset, diarrhea, ; SALMON  AND TUNA ; MOSTLY OILY FISHES     BP 114/63   Ht 5\' 6"  (1.676 m)   Wt 130 lb (59 kg)   BMI 20.98 kg/m      05/02/2020    2:48 PM  [facility] Adult Exercise  Frequency of aerobic exercise (# of days/week) 5  Average time in minutes 60  Frequency of strengthening activities (# of days/week) 3        No data to display              Objective:  Physical Exam:  Gen:  NAD, comfortable in exam room  ***   Assessment & Plan:  1. ***  Janeal Holmes, MD PGY-2, [facility]

## 2023-04-02 ENCOUNTER — Encounter: Payer: Self-pay | Admitting: Family Medicine

## 2023-04-02 NOTE — Progress Notes (Unsigned)
PCP: Cleatis Polka., MD  Subjective:   HPI: Patient is a 62 y.o. female here for orthotics.  Patient returns for custom orthotics. Has used sports insoles in past with metatarsal pads for her metatarsalgia and interested in custom ones.  Past Medical History:  Diagnosis Date   Abnormal Pap smear    H/O   History of gastrointestinal stromal tumor (GIST)    Hypothyroidism    Thyroid disease    hypothyroid    Current Outpatient Medications on File Prior to Visit  Medication Sig Dispense Refill   CALCIUM PO Take 1,500 mg by mouth daily.      Cholecalciferol (VITAMIN D PO) Take 2,000 Units by mouth daily.      Multiple Vitamin (MULTIVITAMIN WITH MINERALS) TABS tablet Take 1 tablet daily by mouth.     SYNTHROID 75 MCG tablet Take 75 mcg daily before breakfast by mouth.      zaleplon (SONATA) 10 MG capsule Take 10 mg at bedtime as needed by mouth for sleep.     No current facility-administered medications on file prior to visit.    Past Surgical History:  Procedure Laterality Date   CERVICAL BIOPSY  W/ LOOP ELECTRODE EXCISION  2003   cold knife conization  3/12   EUS N/A 04/08/2017   Procedure: UPPER ENDOSCOPIC ULTRASOUND (EUS) LINEAR;  Surgeon: Rachael Fee, MD;  Location: [facility];  Service: Endoscopy;  Laterality: N/A;   LAPAROSCOPIC GASTRIC RESECTION N/A 06/01/2017   Procedure: LAPAROSCOPIC RESECTION GASTRIC OF GIST;  Surgeon: Glenna Fellows, MD;  Location: [facility];  Service: General;  Laterality: N/A;    Allergies  Allergen Reactions   Imatinib Rash    Affected liver   Fish Allergy     Stomach upset, diarrhea, ; SALMON  AND TUNA ; MOSTLY OILY FISHES     BP 114/63   Ht 5\' 6"  (1.676 m)   Wt 130 lb (59 kg)   BMI 20.98 kg/m      05/02/2020    2:48 PM  [facility]  Frequency of aerobic exercise (# of days/week) 5  Average time in minutes 60  Frequency of strengthening activities (# of days/week) 3        No data to  display              Objective:  Physical Exam:  Gen: NAD, comfortable in exam room  Bilateral feet/ankles: Transverse arch collapse with callus under 2nd-4th metatarsal heads.  Mod sized bunion right.  No other gross deformity, swelling, ecchymoses Full range of motion No tenderness to palpation NV intact distally. Leg lengths equal.   Assessment & Plan:  1. Metatarsalgia - custom orthotics made today with addition of small metatarsal pads.  Advised will need to replace the pads every 6 months.  F/u prn.  Patient was fitted for a : standard, cushioned, semi-rigid orthotic. The orthotic was heated and afterward the patient stood on the orthotic blank positioned on the orthotic stand. The patient was positioned in subtalar neutral position and 10 degrees of ankle dorsiflexion in a weight bearing stance. After completion of molding, a stable base was applied to the orthotic blank. The blank was ground to a stable position for weight bearing. Size:9 fit & run Base: none Posting:none Additional orthotic padding: none

## 2023-04-04 ENCOUNTER — Encounter: Payer: Self-pay | Admitting: Family Medicine

## 2023-04-04 NOTE — Patient Instructions (Signed)
f °

## 2023-04-08 ENCOUNTER — Other Ambulatory Visit: Payer: Federal, State, Local not specified - PPO

## 2023-04-14 ENCOUNTER — Encounter: Payer: Self-pay | Admitting: Pulmonary Disease

## 2023-04-14 NOTE — Telephone Encounter (Signed)
Telephone call  

## 2023-04-16 ENCOUNTER — Other Ambulatory Visit: Payer: Federal, State, Local not specified - PPO

## 2023-04-28 DIAGNOSIS — R6882 Decreased libido: Secondary | ICD-10-CM | POA: Diagnosis not present

## 2023-05-03 DIAGNOSIS — L821 Other seborrheic keratosis: Secondary | ICD-10-CM | POA: Diagnosis not present

## 2023-05-03 DIAGNOSIS — L814 Other melanin hyperpigmentation: Secondary | ICD-10-CM | POA: Diagnosis not present

## 2023-05-03 DIAGNOSIS — L723 Sebaceous cyst: Secondary | ICD-10-CM | POA: Diagnosis not present

## 2023-05-03 DIAGNOSIS — D2272 Melanocytic nevi of left lower limb, including hip: Secondary | ICD-10-CM | POA: Diagnosis not present

## 2023-05-03 DIAGNOSIS — L570 Actinic keratosis: Secondary | ICD-10-CM | POA: Diagnosis not present

## 2023-05-05 DIAGNOSIS — E785 Hyperlipidemia, unspecified: Secondary | ICD-10-CM | POA: Diagnosis not present

## 2023-05-05 DIAGNOSIS — M810 Age-related osteoporosis without current pathological fracture: Secondary | ICD-10-CM | POA: Diagnosis not present

## 2023-05-05 DIAGNOSIS — E039 Hypothyroidism, unspecified: Secondary | ICD-10-CM | POA: Diagnosis not present

## 2023-05-12 DIAGNOSIS — Z1331 Encounter for screening for depression: Secondary | ICD-10-CM | POA: Diagnosis not present

## 2023-05-12 DIAGNOSIS — Z1339 Encounter for screening examination for other mental health and behavioral disorders: Secondary | ICD-10-CM | POA: Diagnosis not present

## 2023-05-12 DIAGNOSIS — C49A2 Gastrointestinal stromal tumor of stomach: Secondary | ICD-10-CM | POA: Diagnosis not present

## 2023-05-12 DIAGNOSIS — Z0000 Encounter for general adult medical examination without abnormal findings: Secondary | ICD-10-CM | POA: Diagnosis not present

## 2023-05-12 DIAGNOSIS — R82998 Other abnormal findings in urine: Secondary | ICD-10-CM | POA: Diagnosis not present

## 2023-06-14 DIAGNOSIS — R6882 Decreased libido: Secondary | ICD-10-CM | POA: Diagnosis not present

## 2023-06-21 DIAGNOSIS — H00022 Hordeolum internum right lower eyelid: Secondary | ICD-10-CM | POA: Diagnosis not present

## 2023-07-19 DIAGNOSIS — G43B0 Ophthalmoplegic migraine, not intractable: Secondary | ICD-10-CM | POA: Diagnosis not present

## 2023-07-19 DIAGNOSIS — H53143 Visual discomfort, bilateral: Secondary | ICD-10-CM | POA: Diagnosis not present

## 2023-07-25 ENCOUNTER — Other Ambulatory Visit: Payer: Self-pay | Admitting: Medical Genetics

## 2023-07-28 DIAGNOSIS — R6882 Decreased libido: Secondary | ICD-10-CM | POA: Diagnosis not present

## 2023-08-04 ENCOUNTER — Other Ambulatory Visit: Payer: Self-pay

## 2023-08-04 DIAGNOSIS — Z006 Encounter for examination for normal comparison and control in clinical research program: Secondary | ICD-10-CM

## 2023-08-17 LAB — GENECONNECT MOLECULAR SCREEN: Genetic Analysis Overall Interpretation: NEGATIVE

## 2023-09-06 DIAGNOSIS — R6882 Decreased libido: Secondary | ICD-10-CM | POA: Diagnosis not present

## 2023-09-15 DIAGNOSIS — H00021 Hordeolum internum right upper eyelid: Secondary | ICD-10-CM | POA: Diagnosis not present

## 2023-09-21 DIAGNOSIS — L820 Inflamed seborrheic keratosis: Secondary | ICD-10-CM | POA: Diagnosis not present

## 2023-09-21 DIAGNOSIS — L570 Actinic keratosis: Secondary | ICD-10-CM | POA: Diagnosis not present

## 2023-10-13 DIAGNOSIS — R6882 Decreased libido: Secondary | ICD-10-CM | POA: Diagnosis not present

## 2023-10-15 DIAGNOSIS — Z13820 Encounter for screening for osteoporosis: Secondary | ICD-10-CM | POA: Diagnosis not present

## 2023-11-23 DIAGNOSIS — H00021 Hordeolum internum right upper eyelid: Secondary | ICD-10-CM | POA: Diagnosis not present

## 2023-11-24 DIAGNOSIS — R6882 Decreased libido: Secondary | ICD-10-CM | POA: Diagnosis not present

## 2023-11-30 DIAGNOSIS — Z903 Acquired absence of stomach [part of]: Secondary | ICD-10-CM | POA: Diagnosis not present

## 2023-11-30 DIAGNOSIS — C49A2 Gastrointestinal stromal tumor of stomach: Secondary | ICD-10-CM | POA: Diagnosis not present

## 2023-11-30 DIAGNOSIS — Z08 Encounter for follow-up examination after completed treatment for malignant neoplasm: Secondary | ICD-10-CM | POA: Diagnosis not present

## 2023-12-02 DIAGNOSIS — C49A2 Gastrointestinal stromal tumor of stomach: Secondary | ICD-10-CM | POA: Diagnosis not present

## 2023-12-07 DIAGNOSIS — Z08 Encounter for follow-up examination after completed treatment for malignant neoplasm: Secondary | ICD-10-CM | POA: Diagnosis not present

## 2023-12-07 DIAGNOSIS — Z8739 Personal history of other diseases of the musculoskeletal system and connective tissue: Secondary | ICD-10-CM | POA: Diagnosis not present

## 2023-12-07 DIAGNOSIS — C49A2 Gastrointestinal stromal tumor of stomach: Secondary | ICD-10-CM | POA: Diagnosis not present

## 2023-12-14 ENCOUNTER — Ambulatory Visit: Admitting: Family Medicine

## 2024-01-05 DIAGNOSIS — R6882 Decreased libido: Secondary | ICD-10-CM | POA: Diagnosis not present

## 2024-01-11 DIAGNOSIS — Z5181 Encounter for therapeutic drug level monitoring: Secondary | ICD-10-CM | POA: Diagnosis not present

## 2024-01-11 DIAGNOSIS — M810 Age-related osteoporosis without current pathological fracture: Secondary | ICD-10-CM | POA: Diagnosis not present

## 2024-01-11 DIAGNOSIS — Z7189 Other specified counseling: Secondary | ICD-10-CM | POA: Diagnosis not present

## 2024-01-11 DIAGNOSIS — Z7983 Long term (current) use of bisphosphonates: Secondary | ICD-10-CM | POA: Diagnosis not present

## 2024-01-18 DIAGNOSIS — M810 Age-related osteoporosis without current pathological fracture: Secondary | ICD-10-CM | POA: Diagnosis not present

## 2024-01-19 ENCOUNTER — Telehealth (HOSPITAL_COMMUNITY): Payer: Self-pay

## 2024-01-19 NOTE — Telephone Encounter (Signed)
 Auth Submission: NO AUTH NEEDED Site of care: Site of care: [facility] Payer: BCBS Federal Medication & CPT/J Code(s) submitted: Reclast  (Zolendronic acid) [id number] Diagnosis Code: M81.0 Route of submission (phone, fax, portal):  Phone # Fax # Auth type: Buy/Bill HB Units/visits requested: 5mg  x 1 dose Reference number:  Approval from: 01/19/24 to 05/24/24

## 2024-02-02 ENCOUNTER — Other Ambulatory Visit (HOSPITAL_COMMUNITY): Payer: Self-pay | Admitting: Internal Medicine

## 2024-02-09 ENCOUNTER — Ambulatory Visit (HOSPITAL_COMMUNITY)
Admission: RE | Admit: 2024-02-09 | Discharge: 2024-02-09 | Disposition: A | Discharge status: Home / Self Care | Source: Ambulatory Visit | Attending: Internal Medicine | Admitting: Internal Medicine

## 2024-02-09 VITALS — BP 109/70 | HR 50 | Temp 97.5°F | Resp 16

## 2024-02-09 DIAGNOSIS — M810 Age-related osteoporosis without current pathological fracture: Secondary | ICD-10-CM | POA: Insufficient documentation

## 2024-02-09 MED ORDER — ZOLEDRONIC ACID 5 MG/100ML IV SOLN
5.0000 mg | Freq: Once | INTRAVENOUS | Status: AC
  Administered 2024-02-09: 5 mg via INTRAVENOUS

## 2024-02-09 MED ORDER — ZOLEDRONIC ACID 5 MG/100ML IV SOLN
INTRAVENOUS | Status: AC
  Filled 2024-02-09: qty 100

## 2024-02-09 MED ORDER — SODIUM CHLORIDE 0.9 % IV SOLN: INTRAVENOUS | Status: DC

## 2024-02-11 ENCOUNTER — Encounter (HOSPITAL_COMMUNITY)

## 2024-02-15 ENCOUNTER — Encounter: Payer: Self-pay | Admitting: Internal Medicine

## 2024-02-16 DIAGNOSIS — R6882 Decreased libido: Secondary | ICD-10-CM | POA: Diagnosis not present

## 2024-03-07 ENCOUNTER — Encounter: Payer: Self-pay | Admitting: Internal Medicine

## 2024-03-27 ENCOUNTER — Encounter

## 2024-04-05 DIAGNOSIS — Z124 Encounter for screening for malignant neoplasm of cervix: Secondary | ICD-10-CM | POA: Diagnosis not present

## 2024-04-05 DIAGNOSIS — Z1231 Encounter for screening mammogram for malignant neoplasm of breast: Secondary | ICD-10-CM | POA: Diagnosis not present

## 2024-04-05 DIAGNOSIS — Z01419 Encounter for gynecological examination (general) (routine) without abnormal findings: Secondary | ICD-10-CM | POA: Diagnosis not present

## 2024-04-05 DIAGNOSIS — Z6821 Body mass index (BMI) 21.0-21.9, adult: Secondary | ICD-10-CM | POA: Diagnosis not present

## 2024-04-05 DIAGNOSIS — Z23 Encounter for immunization: Secondary | ICD-10-CM | POA: Diagnosis not present

## 2024-04-11 ENCOUNTER — Encounter: Admitting: Internal Medicine

## 2024-05-01 ENCOUNTER — Telehealth: Payer: Self-pay

## 2024-05-01 NOTE — Telephone Encounter (Signed)
 RN scheduled Pre visit on 06/06/2024 at 10:00AM and colonoscopy with Dr. Avram on 07/05/2024 at 10:30 AM.  Letter sent via mychart and mail.

## 2024-05-01 NOTE — Telephone Encounter (Signed)
 Patient would like a February appt. for recall colonoscopy, preferably on a Wednesday.  Dr. Darilyn schedule is not out for February. Please call patient when February's schedule is out. Thank you.

## 2024-05-02 ENCOUNTER — Encounter: Admitting: Internal Medicine

## 2024-06-06 ENCOUNTER — Ambulatory Visit: Admitting: *Deleted

## 2024-06-06 VITALS — Ht 66.0 in | Wt 130.0 lb

## 2024-06-06 DIAGNOSIS — Z1211 Encounter for screening for malignant neoplasm of colon: Secondary | ICD-10-CM

## 2024-06-06 DIAGNOSIS — Z83719 Family history of colon polyps, unspecified: Secondary | ICD-10-CM

## 2024-06-06 MED ORDER — NA SULFATE-K SULFATE-MG SULF 17.5-3.13-1.6 GM/177ML PO SOLN: 1.0000 | Freq: Once | ORAL | 0 refills | Status: AC

## 2024-06-06 NOTE — Progress Notes (Signed)
 Pt's name and DOB verified at the beginning of the pre-visit with 2 identifiers  Pt denies any difficulty with ambulating,sitting, laying down or rolling side to side  Pt has no issues moving head neck or swallowing  No egg or soy allergy known to patient   No issues known to pt with past sedation  No FH of Malignant Hyperthermia  Pt is not on home 02   Pt is not on blood thinners   Pt denies issues with constipation   Pt is not on dialysis  Pt denise any abnormal heart rhythms but has recent dx Heart Murmur  Pt denies any upcoming cardiac testing  Patient's chart reviewed by Norleen Schillings CNRA prior to pre-visit and patient appropriate for the [facility].  Pre-visit completed and red dot placed by patient's name on their procedure day (on provider's schedule).     Visit by phone  Pt states weight is 130 lb  Pt given  both [facility] main # and MD on call # prior to instructions.  Informed pt to come in at the time discussed and is shown on PV instructions.  Pt instructed to use Singlecare.com or GoodRx for a price reduction on prep  Instructed pt where to find PV instructions in My Chart. . Instructed pt on all aspects of written instructions including med holds clothing to wear and foods to eat and not eat as well as after procedure legal restrictions and to call MD on call if needed.. Pt states understanding. Instructed pt to review instructions again prior to procedure and call main # given if has any questions or any issues. Pt states they will.

## 2024-06-14 ENCOUNTER — Encounter: Admitting: Internal Medicine

## 2024-06-20 ENCOUNTER — Encounter: Payer: Self-pay | Admitting: Internal Medicine

## 2024-07-05 ENCOUNTER — Encounter: Admitting: Internal Medicine
